# Patient Record
Sex: Male | Born: 1965 | Race: White | Hispanic: No | Smoking: Former smoker
Health system: Southern US, Community
[De-identification: ages and names within clinical notes are randomized; demographics above are authoritative.]

## PROBLEM LIST (undated history)

## (undated) DIAGNOSIS — J939 Pneumothorax, unspecified: Secondary | ICD-10-CM

## (undated) DIAGNOSIS — T7840XA Allergy, unspecified, initial encounter: Secondary | ICD-10-CM

## (undated) HISTORY — DX: Allergy, unspecified, initial encounter: T78.40XA

---

## 2006-02-18 ENCOUNTER — Other Ambulatory Visit: Payer: Self-pay

## 2006-02-19 ENCOUNTER — Inpatient Hospital Stay: Payer: Self-pay

## 2015-04-12 ENCOUNTER — Inpatient Hospital Stay: Payer: Self-pay | Admitting: Certified Registered"

## 2015-04-12 ENCOUNTER — Emergency Department: Payer: Self-pay

## 2015-04-12 ENCOUNTER — Encounter
Admission: EM | Disposition: A | Discharge status: Home / Self Care | Payer: Self-pay | Source: Home / Self Care | Attending: Surgery

## 2015-04-12 ENCOUNTER — Encounter: Payer: Self-pay | Admitting: Emergency Medicine

## 2015-04-12 ENCOUNTER — Inpatient Hospital Stay
Admission: EM | Admit: 2015-04-12 | Discharge: 2015-04-17 | DRG: 339 | Disposition: A | Discharge status: Home / Self Care | Payer: Self-pay | Attending: Surgery | Admitting: Surgery

## 2015-04-12 DIAGNOSIS — K352 Acute appendicitis with generalized peritonitis, without abscess: Secondary | ICD-10-CM

## 2015-04-12 DIAGNOSIS — K353 Acute appendicitis with localized peritonitis: Principal | ICD-10-CM | POA: Diagnosis present

## 2015-04-12 DIAGNOSIS — K35209 Acute appendicitis with generalized peritonitis, without abscess, unspecified as to perforation: Secondary | ICD-10-CM | POA: Insufficient documentation

## 2015-04-12 DIAGNOSIS — R10811 Right upper quadrant abdominal tenderness: Secondary | ICD-10-CM

## 2015-04-12 DIAGNOSIS — K3580 Unspecified acute appendicitis: Secondary | ICD-10-CM | POA: Diagnosis present

## 2015-04-12 DIAGNOSIS — K567 Ileus, unspecified: Secondary | ICD-10-CM | POA: Diagnosis present

## 2015-04-12 DIAGNOSIS — E669 Obesity, unspecified: Secondary | ICD-10-CM | POA: Diagnosis present

## 2015-04-12 DIAGNOSIS — K3533 Acute appendicitis with perforation and localized peritonitis, with abscess: Secondary | ICD-10-CM

## 2015-04-12 DIAGNOSIS — R6883 Chills (without fever): Secondary | ICD-10-CM | POA: Diagnosis present

## 2015-04-12 HISTORY — PX: APPENDECTOMY: SHX54

## 2015-04-12 LAB — LIPASE, BLOOD: LIPASE: 13 U/L — AB (ref 22–51)

## 2015-04-12 LAB — URINALYSIS COMPLETE WITH MICROSCOPIC (ARMC ONLY)
BACTERIA UA: NONE SEEN
BILIRUBIN URINE: NEGATIVE
Glucose, UA: NEGATIVE mg/dL
Hgb urine dipstick: NEGATIVE
Ketones, ur: NEGATIVE mg/dL
Nitrite: NEGATIVE
PROTEIN: 30 mg/dL — AB
SPECIFIC GRAVITY, URINE: 1.021 (ref 1.005–1.030)
SQUAMOUS EPITHELIAL / LPF: NONE SEEN
pH: 6 (ref 5.0–8.0)

## 2015-04-12 LAB — CBC
HCT: 46.8 % (ref 40.0–52.0)
Hemoglobin: 15.8 g/dL (ref 13.0–18.0)
MCH: 31.4 pg (ref 26.0–34.0)
MCHC: 33.7 g/dL (ref 32.0–36.0)
MCV: 93.3 fL (ref 80.0–100.0)
PLATELETS: 276 10*3/uL (ref 150–440)
RBC: 5.01 MIL/uL (ref 4.40–5.90)
RDW: 12.3 % (ref 11.5–14.5)
WBC: 15.4 10*3/uL — AB (ref 3.8–10.6)

## 2015-04-12 LAB — COMPREHENSIVE METABOLIC PANEL
ALK PHOS: 67 U/L (ref 38–126)
ALT: 42 U/L (ref 17–63)
AST: 26 U/L (ref 15–41)
Albumin: 4.3 g/dL (ref 3.5–5.0)
Anion gap: 12 (ref 5–15)
BUN: 12 mg/dL (ref 6–20)
CHLORIDE: 100 mmol/L — AB (ref 101–111)
CO2: 25 mmol/L (ref 22–32)
CREATININE: 1.11 mg/dL (ref 0.61–1.24)
Calcium: 9.3 mg/dL (ref 8.9–10.3)
Glucose, Bld: 131 mg/dL — ABNORMAL HIGH (ref 65–99)
Potassium: 4 mmol/L (ref 3.5–5.1)
Sodium: 137 mmol/L (ref 135–145)
Total Bilirubin: 1.4 mg/dL — ABNORMAL HIGH (ref 0.3–1.2)
Total Protein: 8.1 g/dL (ref 6.5–8.1)

## 2015-04-12 LAB — TROPONIN I

## 2015-04-12 SURGERY — APPENDECTOMY
Anesthesia: General | Wound class: Contaminated

## 2015-04-12 MED ORDER — ACETAMINOPHEN 650 MG RE SUPP
650.0000 mg | Freq: Four times a day (QID) | RECTAL | Status: DC | PRN
Start: 2015-04-12 — End: 2015-04-17

## 2015-04-12 MED ORDER — HYDROMORPHONE HCL 1 MG/ML IJ SOLN
INTRAMUSCULAR | Status: AC
  Filled 2015-04-12: qty 1

## 2015-04-12 MED ORDER — ACETAMINOPHEN 325 MG PO TABS: 650.0000 mg | ORAL_TABLET | Freq: Four times a day (QID) | ORAL | Status: DC | PRN

## 2015-04-12 MED ORDER — DEXTROSE 5 % IV SOLN
1.0000 g | Freq: Once | INTRAVENOUS | Status: AC
  Administered 2015-04-12: 1 g via INTRAVENOUS
  Filled 2015-04-12: qty 1

## 2015-04-12 MED ORDER — DEXTROSE 5 % IV SOLN
1.0000 g | Freq: Two times a day (BID) | INTRAVENOUS | Status: DC
  Filled 2015-04-12 (×2): qty 10

## 2015-04-12 MED ORDER — KETOROLAC TROMETHAMINE 30 MG/ML IJ SOLN
INTRAMUSCULAR | Status: DC | PRN
  Administered 2015-04-12: 30 mg via INTRAVENOUS

## 2015-04-12 MED ORDER — DEXAMETHASONE SODIUM PHOSPHATE 4 MG/ML IJ SOLN
INTRAMUSCULAR | Status: DC | PRN
  Administered 2015-04-12: 10 mg via INTRAVENOUS

## 2015-04-12 MED ORDER — LACTATED RINGERS IV SOLN
INTRAVENOUS | Status: DC
  Administered 2015-04-12 (×2): via INTRAVENOUS

## 2015-04-12 MED ORDER — HYDROMORPHONE HCL 1 MG/ML IJ SOLN
1.0000 mg | INTRAMUSCULAR | Status: DC | PRN
  Administered 2015-04-12 – 2015-04-15 (×10): 1 mg via INTRAVENOUS
  Filled 2015-04-12 (×10): qty 1

## 2015-04-12 MED ORDER — PROPOFOL 10 MG/ML IV BOLUS
INTRAVENOUS | Status: DC | PRN
  Administered 2015-04-12: 180 mg via INTRAVENOUS

## 2015-04-12 MED ORDER — SODIUM CHLORIDE 0.9 % IV BOLUS (SEPSIS)
1000.0000 mL | Freq: Once | INTRAVENOUS | Status: AC
  Administered 2015-04-12: 1000 mL via INTRAVENOUS

## 2015-04-12 MED ORDER — ONDANSETRON HCL 4 MG PO TABS: 4.0000 mg | ORAL_TABLET | Freq: Four times a day (QID) | ORAL | Status: DC | PRN

## 2015-04-12 MED ORDER — LACTATED RINGERS IV SOLN
INTRAVENOUS | Status: DC
  Administered 2015-04-12: 12:00:00 via INTRAVENOUS

## 2015-04-12 MED ORDER — ONDANSETRON HCL 4 MG/2ML IJ SOLN
INTRAMUSCULAR | Status: AC
  Filled 2015-04-12: qty 2

## 2015-04-12 MED ORDER — HYDROCODONE-ACETAMINOPHEN 5-325 MG PO TABS
1.0000 | ORAL_TABLET | ORAL | Status: DC | PRN
  Administered 2015-04-13 – 2015-04-14 (×6): 2 via ORAL
  Administered 2015-04-14 – 2015-04-15 (×2): 1 via ORAL
  Administered 2015-04-16: 2 via ORAL
  Administered 2015-04-16: 1 via ORAL
  Administered 2015-04-16 – 2015-04-17 (×2): 2 via ORAL
  Filled 2015-04-12 (×3): qty 2
  Filled 2015-04-12 (×2): qty 1
  Filled 2015-04-12 (×4): qty 2
  Filled 2015-04-12: qty 1
  Filled 2015-04-12 (×2): qty 2

## 2015-04-12 MED ORDER — DEXTROSE 5 % IV SOLN: 1.0000 g | Freq: Once | INTRAVENOUS | Status: DC

## 2015-04-12 MED ORDER — FENTANYL CITRATE (PF) 100 MCG/2ML IJ SOLN
25.0000 ug | INTRAMUSCULAR | Status: DC | PRN
  Administered 2015-04-12 (×6): 25 ug via INTRAVENOUS

## 2015-04-12 MED ORDER — HYDROMORPHONE HCL 1 MG/ML IJ SOLN
1.0000 mg | Freq: Once | INTRAMUSCULAR | Status: AC
  Administered 2015-04-12: 1 mg via INTRAVENOUS

## 2015-04-12 MED ORDER — NEOSTIGMINE METHYLSULFATE 10 MG/10ML IV SOLN
INTRAVENOUS | Status: DC | PRN
  Administered 2015-04-12: 3 mg via INTRAVENOUS

## 2015-04-12 MED ORDER — METRONIDAZOLE IN NACL 5-0.79 MG/ML-% IV SOLN
500.0000 mg | Freq: Three times a day (TID) | INTRAVENOUS | Status: DC
  Administered 2015-04-12: 500 mg via INTRAVENOUS
  Filled 2015-04-12 (×3): qty 100

## 2015-04-12 MED ORDER — ONDANSETRON HCL 4 MG/2ML IJ SOLN: 4.0000 mg | Freq: Once | INTRAMUSCULAR | Status: DC | PRN

## 2015-04-12 MED ORDER — ROCURONIUM BROMIDE 100 MG/10ML IV SOLN
INTRAVENOUS | Status: DC | PRN
  Administered 2015-04-12: 20 mg via INTRAVENOUS
  Administered 2015-04-12 (×3): 10 mg via INTRAVENOUS
  Administered 2015-04-12: 30 mg via INTRAVENOUS

## 2015-04-12 MED ORDER — FENTANYL CITRATE (PF) 100 MCG/2ML IJ SOLN
INTRAMUSCULAR | Status: AC
  Administered 2015-04-12: 25 ug via INTRAVENOUS
  Filled 2015-04-12: qty 2

## 2015-04-12 MED ORDER — ACETAMINOPHEN 650 MG RE SUPP: 650.0000 mg | Freq: Four times a day (QID) | RECTAL | Status: DC | PRN

## 2015-04-12 MED ORDER — ONDANSETRON HCL 4 MG/2ML IJ SOLN
4.0000 mg | Freq: Once | INTRAMUSCULAR | Status: AC
  Administered 2015-04-12: 4 mg via INTRAVENOUS
  Filled 2015-04-12: qty 2

## 2015-04-12 MED ORDER — PIPERACILLIN-TAZOBACTAM 4.5 G IVPB
4.5000 g | Freq: Three times a day (TID) | INTRAVENOUS | Status: DC
  Administered 2015-04-12 – 2015-04-16 (×11): 4.5 g via INTRAVENOUS
  Filled 2015-04-12 (×15): qty 100

## 2015-04-12 MED ORDER — MIDAZOLAM HCL 2 MG/2ML IJ SOLN
INTRAMUSCULAR | Status: DC | PRN
  Administered 2015-04-12: 2 mg via INTRAVENOUS

## 2015-04-12 MED ORDER — ONDANSETRON HCL 4 MG/2ML IJ SOLN
4.0000 mg | Freq: Four times a day (QID) | INTRAMUSCULAR | Status: DC | PRN
  Administered 2015-04-13 – 2015-04-15 (×3): 4 mg via INTRAVENOUS
  Filled 2015-04-12 (×3): qty 2

## 2015-04-12 MED ORDER — SUCCINYLCHOLINE CHLORIDE 20 MG/ML IJ SOLN
INTRAMUSCULAR | Status: DC | PRN
  Administered 2015-04-12: 100 mg via INTRAVENOUS

## 2015-04-12 MED ORDER — IOHEXOL 240 MG/ML SOLN
25.0000 mL | Freq: Once | INTRAMUSCULAR | Status: AC | PRN
  Administered 2015-04-12: 25 mL via ORAL

## 2015-04-12 MED ORDER — ONDANSETRON HCL 4 MG/2ML IJ SOLN
INTRAMUSCULAR | Status: DC | PRN
  Administered 2015-04-12: 4 mg via INTRAVENOUS

## 2015-04-12 MED ORDER — ENOXAPARIN SODIUM 40 MG/0.4ML ~~LOC~~ SOLN
40.0000 mg | SUBCUTANEOUS | Status: DC
  Administered 2015-04-12 – 2015-04-16 (×5): 40 mg via SUBCUTANEOUS
  Filled 2015-04-12 (×5): qty 0.4

## 2015-04-12 MED ORDER — MORPHINE SULFATE 2 MG/ML IJ SOLN
2.0000 mg | INTRAMUSCULAR | Status: DC | PRN
  Administered 2015-04-12: 2 mg via INTRAVENOUS
  Filled 2015-04-12: qty 1

## 2015-04-12 MED ORDER — ONDANSETRON HCL 4 MG/2ML IJ SOLN
4.0000 mg | Freq: Once | INTRAMUSCULAR | Status: AC
  Administered 2015-04-12: 4 mg via INTRAVENOUS

## 2015-04-12 MED ORDER — FENTANYL CITRATE (PF) 100 MCG/2ML IJ SOLN
INTRAMUSCULAR | Status: DC | PRN
  Administered 2015-04-12 (×4): 50 ug via INTRAVENOUS

## 2015-04-12 MED ORDER — PIPERACILLIN-TAZOBACTAM 3.375 G IVPB 30 MIN: 3.3750 g | Freq: Four times a day (QID) | INTRAVENOUS | Status: DC

## 2015-04-12 MED ORDER — GLYCOPYRROLATE 0.2 MG/ML IJ SOLN
INTRAMUSCULAR | Status: DC | PRN
  Administered 2015-04-12: 0.6 mg via INTRAVENOUS
  Administered 2015-04-12: 0.2 mg via INTRAVENOUS

## 2015-04-12 MED ORDER — PIPERACILLIN-TAZOBACTAM 3.375 G IVPB
3.3750 g | Freq: Once | INTRAVENOUS | Status: AC
  Administered 2015-04-12: 3.375 g via INTRAVENOUS
  Filled 2015-04-12: qty 50

## 2015-04-12 MED ORDER — HYDROMORPHONE HCL 1 MG/ML IJ SOLN
1.0000 mg | Freq: Once | INTRAMUSCULAR | Status: AC
  Administered 2015-04-12: 1 mg via INTRAVENOUS
  Filled 2015-04-12: qty 1

## 2015-04-12 MED ORDER — IOHEXOL 300 MG/ML  SOLN
125.0000 mL | Freq: Once | INTRAMUSCULAR | Status: AC | PRN
  Administered 2015-04-12: 125 mL via INTRAVENOUS

## 2015-04-12 MED ORDER — LIDOCAINE HCL (CARDIAC) 20 MG/ML IV SOLN
INTRAVENOUS | Status: DC | PRN
  Administered 2015-04-12: 100 mg via INTRAVENOUS

## 2015-04-12 SURGICAL SUPPLY — 55 items
BULB RESERV EVAC DRAIN JP 100C (MISCELLANEOUS) ×4 IMPLANT
CANISTER SUCT 1200ML W/VALVE (MISCELLANEOUS) ×4 IMPLANT
CHLORAPREP W/TINT 26ML (MISCELLANEOUS) ×4 IMPLANT
CLEANER CAUTERY TIP 5X5 PAD (MISCELLANEOUS) ×2 IMPLANT
CLOSURE WOUND 1/2 X4 (GAUZE/BANDAGES/DRESSINGS) ×1
CUTTER LINEAR ENDO 35 ART FLEX (STAPLE) ×4 IMPLANT
CUTTER LINEAR ENDO 35 ETS (STAPLE) ×4 IMPLANT
DEFOGGER SCOPE WARMER CLEARIFY (MISCELLANEOUS) ×4 IMPLANT
DRAIN CHANNEL JP 19F (MISCELLANEOUS) ×4 IMPLANT
DRAPE SHEET LG 3/4 BI-LAMINATE (DRAPES) ×4 IMPLANT
DRAPE UTILITY 15X26 TOWEL STRL (DRAPES) ×8 IMPLANT
DRESSING TELFA 4X3 1S ST N-ADH (GAUZE/BANDAGES/DRESSINGS) ×4 IMPLANT
DRSG OPSITE POSTOP 3X4 (GAUZE/BANDAGES/DRESSINGS) ×8 IMPLANT
DRSG OPSITE POSTOP 4X8 (GAUZE/BANDAGES/DRESSINGS) ×4 IMPLANT
DRSG TEGADERM 2-3/8X2-3/4 SM (GAUZE/BANDAGES/DRESSINGS) ×12 IMPLANT
ENDOPOUCH RETRIEVER 10 (MISCELLANEOUS) ×4 IMPLANT
GAUZE SPONGE 4X4 12PLY STRL (GAUZE/BANDAGES/DRESSINGS) ×4 IMPLANT
GLOVE BIO SURGEON STRL SZ7.5 (GLOVE) ×4 IMPLANT
GOWN STRL REUS W/ TWL LRG LVL3 (GOWN DISPOSABLE) ×2 IMPLANT
GOWN STRL REUS W/ TWL XL LVL3 (GOWN DISPOSABLE) ×2 IMPLANT
GOWN STRL REUS W/TWL LRG LVL3 (GOWN DISPOSABLE) ×2
GOWN STRL REUS W/TWL XL LVL3 (GOWN DISPOSABLE) ×2
HANDLE YANKAUER SUCT BULB TIP (MISCELLANEOUS) ×4 IMPLANT
IRRIGATION STRYKERFLOW (MISCELLANEOUS) ×2 IMPLANT
IRRIGATOR STRYKERFLOW (MISCELLANEOUS) ×4
IV NS 1000ML (IV SOLUTION) ×2
IV NS 1000ML BAXH (IV SOLUTION) ×2 IMPLANT
LABEL OR SOLS (LABEL) ×4 IMPLANT
LAPSAC SURG PACK 8X10 (MISCELLANEOUS) ×8
NDL SAFETY 25GX1.5 (NEEDLE) ×4 IMPLANT
NS IRRIG 500ML POUR BTL (IV SOLUTION) ×4 IMPLANT
PACK LAP CHOLECYSTECTOMY (MISCELLANEOUS) ×4 IMPLANT
PACK SURG LAPSAC 8X10 (MISCELLANEOUS) ×4 IMPLANT
PAD CLEANER CAUTERY TIP 5X5 (MISCELLANEOUS) ×2
PAD GROUND ADULT SPLIT (MISCELLANEOUS) ×4 IMPLANT
PENCIL ELECTRO HAND CTR (MISCELLANEOUS) ×4 IMPLANT
RELOAD CUTTER ETS 35MM STAND (ENDOMECHANICALS) ×4 IMPLANT
SCISSORS METZENBAUM CVD 33 (INSTRUMENTS) ×4 IMPLANT
SHEARS HARMONIC ACE PLUS 36CM (ENDOMECHANICALS) ×4 IMPLANT
SLEEVE ENDOPATH XCEL 5M (ENDOMECHANICALS) ×4 IMPLANT
SPONGE DRAIN TRACH 4X4 STRL 2S (GAUZE/BANDAGES/DRESSINGS) ×4 IMPLANT
STAPLER SKIN PROX 35W (STAPLE) ×4 IMPLANT
STRAP SAFETY BODY (MISCELLANEOUS) ×4 IMPLANT
STRIP CLOSURE SKIN 1/2X4 (GAUZE/BANDAGES/DRESSINGS) ×3 IMPLANT
SUT ETHILON 3-0 KS 30 BLK (SUTURE) ×4 IMPLANT
SUT VIC AB 0 CT1 36 (SUTURE) ×12 IMPLANT
SUT VIC AB 0 UR5 27 (SUTURE) ×8 IMPLANT
SUT VIC AB 1 CT1 36 (SUTURE) ×12 IMPLANT
SUT VIC AB 4-0 RB1 27 (SUTURE) ×2
SUT VIC AB 4-0 RB1 27X BRD (SUTURE) ×2 IMPLANT
SWABSTK COMLB BENZOIN TINCTURE (MISCELLANEOUS) ×4 IMPLANT
TROCAR XCEL 12X100 BLDLESS (ENDOMECHANICALS) ×4 IMPLANT
TROCAR XCEL BLUNT TIP 100MML (ENDOMECHANICALS) ×4 IMPLANT
TROCAR XCEL NON-BLD 5MMX100MML (ENDOMECHANICALS) ×4 IMPLANT
TUBING INSUFFLATOR HI FLOW (MISCELLANEOUS) ×4 IMPLANT

## 2015-04-12 NOTE — ED Provider Notes (Addendum)
[facility] Emergency Department Provider Note  ____________________________________________  Time seen: 7:40 AM  I have reviewed the triage vital signs and the nursing notes.   HISTORY  Chief Complaint Abdominal Pain    HPI Ian Pennington is a 49 y.o. male who complains of right upper quadrant abdominal pain and further right-sided abdominal pain for the last 3-4 days. The pain was gradual in onset and is intermittent and colicky and worse after eating. Over the last 24 hours it is become much worse. Denies fever or chills, but has had one to 2 episodes of vomiting and has persistent nausea. No change in bowel movements, no diarrhea. No chest pain or shortness of breath.  Pain is sharp severe nonradiating no aggravating or alleviating factors.  No prior surgeries. Last oral intake was yesterday except for a small amount of water this morning which she vomited.   History reviewed. No pertinent past medical history.  There are no active problems to display for this patient.   History reviewed. No pertinent past surgical history.  Current Outpatient Rx  Name  Route  Sig  Dispense  Refill  . Naproxen Sodium (ALEVE PO)   Oral   Take 2 tablets by mouth once.           Allergies Review of patient's allergies indicates no known allergies.  No family history on file.  Social History History  Substance Use Topics  . Smoking status: Never Smoker   . Smokeless tobacco: Not on file  . Alcohol Use: Yes     Comment: occasionally    Review of Systems  Constitutional: No fever or chills. No weight changes Eyes:No blurry vision or double vision.  ENT: No sore throat. Cardiovascular: No chest pain. Respiratory: No dyspnea or cough. Gastrointestinal: abdominal pain with vomiting as above.  No BRBPR or melena. Genitourinary: Negative for dysuria, urinary retention, bloody urine, or difficulty urinating. Musculoskeletal: Negative for back pain. No  joint swelling or pain. Skin: Negative for rash. Neurological: Negative for headaches, focal weakness or numbness. Psychiatric:No anxiety or depression.   Endocrine:No hot/cold intolerance, changes in energy, or sleep difficulty.  10-point ROS otherwise negative.  ____________________________________________   PHYSICAL EXAM:  VITAL SIGNS: ED Triage Vitals  Enc Vitals Group     BP 04/12/15 0719 135/80 mmHg     Pulse Rate 04/12/15 0719 88     Resp 04/12/15 0719 16     Temp 04/12/15 0719 98.3 F (36.8 C)     Temp Source 04/12/15 0719 Oral     SpO2 04/12/15 0719 98 %     Weight 04/12/15 0711 300 lb (136.079 kg)     Height 04/12/15 0711 6\' 4"  (1.93 m)     Head Cir --      Peak Flow --      Pain Score 04/12/15 0711 8     Pain Loc --      Pain Edu? --      Excl. in GC? --      Constitutional: Alert and oriented. Moderate distress due to pain. Eyes: No scleral icterus. No conjunctival pallor. PERRL. EOMI ENT   Head: Normocephalic and atraumatic.   Nose: No congestion/rhinnorhea. No septal hematoma   Mouth/Throat: MMM, no pharyngeal erythema. No peritonsillar mass. No uvula shift.   Neck: No stridor. No SubQ emphysema. No meningismus. Hematological/Lymphatic/Immunilogical: No cervical lymphadenopathy. Cardiovascular: RRR. Normal and symmetric distal pulses are present in all extremities. No murmurs, rubs, or gallops. Respiratory: Normal respiratory effort  without tachypnea nor retractions. Breath sounds are clear and equal bilaterally. No wheezes/rales/rhonchi. Gastrointestinal:guarding with palpation, tender in the right upper quadrant, right mid abdomen, and right lower quadrant. Worst in the mid abdomen. There is also discomfort with bringing the right knee up toward the chest.. No distention. There is no CVA tenderness.  Abdomen is not rigid. Genitourinary: deferred Musculoskeletal: Nontender with normal range of motion in all extremities. No joint effusions.   No lower extremity tenderness.  No edema. Neurologic:   Normal speech and language.  CN 2-10 normal. Motor grossly intact. No pronator drift.  Normal gait. No gross focal neurologic deficits are appreciated.  Skin:  Skin is warm, dry and intact. No rash noted.  No petechiae, purpura, or bullae. Psychiatric: Mood and affect are normal. Speech and behavior are normal. Patient exhibits appropriate insight and judgment.  ____________________________________________    LABS (pertinent positives/negatives) (all labs ordered are listed, but only abnormal results are displayed) Labs Reviewed  LIPASE, BLOOD - Abnormal; Notable for the following:    Lipase 13 (*)    All other components within normal limits  COMPREHENSIVE METABOLIC PANEL - Abnormal; Notable for the following:    Chloride 100 (*)    Glucose, Bld 131 (*)    Total Bilirubin 1.4 (*)    All other components within normal limits  CBC - Abnormal; Notable for the following:    WBC 15.4 (*)    All other components within normal limits  URINALYSIS COMPLETEWITH MICROSCOPIC ([facility] ONLY) - Abnormal; Notable for the following:    Color, Urine AMBER (*)    APPearance CLEAR (*)    Protein, ur 30 (*)    Leukocytes, UA TRACE (*)    All other components within normal limits   ____________________________________________   EKG  Interpreted by me Normal sinus rhythm rate of 73, normal axis and intervals, normal QRS and ST segments with mildly downsloping and inverted T waves in V4 through V6  ____________________________________________    RADIOLOGY  Discussed with radiology, reviewed by me, report reviewed by me. Acute appendicitis with evidence of possible rupture an early phlegmon formation  ____________________________________________   PROCEDURES  ____________________________________________   INITIAL IMPRESSION / ASSESSMENT AND PLAN / ED COURSE  Pertinent labs & imaging results that were available during my care of  the patient were reviewed by me and considered in my medical decision making (see chart for details).  Patient presents with right-sided abdominal pain. Due to the colicky nature that is worsened by eating, this seems more consistent with biliary colic or cholecystitis. We'll start with an ultrasound of the right upper quadrant. However if this is unremarkable, we will have to proceed with a CT scan of the abdomen due to also potential possibility of appendicitis. IV fluids, IV Dilaudid and Zofran for symptom control.  ----------------------------------------- 11:34 AM on 04/12/2015 -----------------------------------------  Ultrasound result was obtained at 9:40 AM which was unremarkable, and reevaluation revealed that the patient was still having significant pain and tenderness in the right side of the abdomen. We proceeded with CT scan at that time. I just recently received a call from radiology that the patient has significant appendicitis. He still complains of severe pain, so we will give him more Zofran and Dilaudid and start him on maintenance IV fluids. Surgery will be consult for further management. ----------------------------------------- 11:41 AM on 04/12/2015 -----------------------------------------  Surgery unavailable due to being in the OR just starting a case. I discussed the case with Dr. Phineas Real OR nurse who  will pass along the consult information. Due to delay in surgery availability and possible rupture with evidence of peritonitis, we will go ahead and start the patient on IV Zosyn.  ----------------------------------------- 12:07 PM on 04/12/2015 -----------------------------------------  Screening EKG suggestive of possible lateral ischemia although symptoms are not consistent with ACS. We'll check a troponin for further screening. ____________________________________________   FINAL CLINICAL IMPRESSION(S) / ED DIAGNOSES  Final diagnoses:  RUQ abdominal tenderness   Acute appendicitis with generalized peritonitis      Sharman Cheek, MD 04/12/15 1142  Sharman Cheek, MD 04/12/15 1207

## 2015-04-12 NOTE — ED Notes (Signed)
Patient presents to the ED with right upper quadrant and central abdominal pain with nausea and some diarrhea since Friday.  Patient states pain is much worse after he eats.  Patient is gaurding abdomen in triage.  Patient reports similar episode in the beginning of July but states pain improved away after a few days.

## 2015-04-12 NOTE — Anesthesia Preprocedure Evaluation (Addendum)
Anesthesia Evaluation  Patient identified by MRN, date of birth, ID band Patient awake    Reviewed: Allergy & Precautions, NPO status , Patient's Chart, lab work & pertinent test results, reviewed documented beta blocker date and time   Airway Mallampati: III  TM Distance: >3 FB     Dental  (+) Chipped, Poor Dentition   Pulmonary          Cardiovascular     Neuro/Psych    GI/Hepatic   Endo/Other  Morbid obesity  Renal/GU      Musculoskeletal   Abdominal   Peds  Hematology   Anesthesia Other Findings Obesity. May be a difficult intubation.  Reproductive/Obstetrics                            Anesthesia Physical Anesthesia Plan  ASA: III  Anesthesia Plan: General   Post-op Pain Management:    Induction: Intravenous  Airway Management Planned: Oral ETT  Additional Equipment:   Intra-op Plan:   Post-operative Plan:   Informed Consent: I have reviewed the patients History and Physical, chart, labs and discussed the procedure including the risks, benefits and alternatives for the proposed anesthesia with the patient or authorized representative who has indicated his/her understanding and acceptance.     Plan Discussed with: CRNA  Anesthesia Plan Comments:         Anesthesia Quick Evaluation

## 2015-04-12 NOTE — H&P (Signed)
Ian Pennington is an 49 y.o. male.     Chief Complaint: Right lower quadrant abdominal pain and nausea  HPI:   49 year old otherwise healthy obese male presents to the emergency room with three-day history of right lower quadrant abdominal pain associated with nausea and some mild anorexia and no fever no sick contacts pain has gotten worse over the last several days. Of note the patient had a similar episode which resolved on its own approximate month ago when another episode 2 weeks ago. He did not seek medical attention at that time.  He denies dysuria. No jaundice no fever.  History reviewed. No pertinent past medical history.  History reviewed. No pertinent past surgical history.  Family history significant for obesity  Social History:  reports that he has never smoked. He does not have any smokeless tobacco history on file. He reports that he drinks alcohol. His drug history is not on file.  Allergies: No Known Allergies   Review of Systems  Constitutional: Positive for malaise/fatigue. Negative for fever, chills and weight loss.  HENT: Negative.   Gastrointestinal: Positive for heartburn and abdominal pain. Negative for constipation.  Skin: Negative.   Neurological: Positive for weakness.  All other systems reviewed and are negative.   Physical Exam:  Physical Exam  Constitutional: He is oriented to person, place, and time and well-developed, well-nourished, and in no distress.  HENT:  Head: Normocephalic and atraumatic.  Eyes: Conjunctivae are normal. Pupils are equal, round, and reactive to light.  Neck: Normal range of motion.  Cardiovascular: Normal rate and regular rhythm.   Pulmonary/Chest: Breath sounds normal. No respiratory distress.  Abdominal: Soft. There is tenderness. There is rebound.    Neurological: He is oriented to person, place, and time.  Skin: Skin is warm and dry.  Psychiatric: Mood, memory, affect and judgment normal.    Blood pressure  137/64, pulse 86, temperature 98.2 F (36.8 C), temperature source Oral, resp. rate 17, height 6' 4"  (1.93 m), weight 300 lb (136.079 kg), SpO2 99 %.  Results for orders placed or performed during the hospital encounter of 04/12/15 (from the past 48 hour(s))  Lipase, blood     Status: Abnormal   Collection Time: 04/12/15  7:18 AM  Result Value Ref Range   Lipase 13 (L) 22 - 51 U/L  Comprehensive metabolic panel     Status: Abnormal   Collection Time: 04/12/15  7:18 AM  Result Value Ref Range   Sodium 137 135 - 145 mmol/L   Potassium 4.0 3.5 - 5.1 mmol/L   Chloride 100 (L) 101 - 111 mmol/L   CO2 25 22 - 32 mmol/L   Glucose, Bld 131 (H) 65 - 99 mg/dL   BUN 12 6 - 20 mg/dL   Creatinine, Ser 1.11 0.61 - 1.24 mg/dL   Calcium 9.3 8.9 - 10.3 mg/dL   Total Protein 8.1 6.5 - 8.1 g/dL   Albumin 4.3 3.5 - 5.0 g/dL   AST 26 15 - 41 U/L   ALT 42 17 - 63 U/L   Alkaline Phosphatase 67 38 - 126 U/L   Total Bilirubin 1.4 (H) 0.3 - 1.2 mg/dL   GFR calc non Af Amer >60 >60 mL/min   GFR calc Af Amer >60 >60 mL/min    Comment: (NOTE) The eGFR has been calculated using the CKD EPI equation. This calculation has not been validated in all clinical situations. eGFR's persistently <60 mL/min signify possible Chronic Kidney Disease.    Anion gap 12  5 - 15  CBC     Status: Abnormal   Collection Time: 04/12/15  7:18 AM  Result Value Ref Range   WBC 15.4 (H) 3.8 - 10.6 K/uL   RBC 5.01 4.40 - 5.90 MIL/uL   Hemoglobin 15.8 13.0 - 18.0 g/dL   HCT 46.8 40.0 - 52.0 %   MCV 93.3 80.0 - 100.0 fL   MCH 31.4 26.0 - 34.0 pg   MCHC 33.7 32.0 - 36.0 g/dL   RDW 12.3 11.5 - 14.5 %   Platelets 276 150 - 440 K/uL  Urinalysis complete, with microscopic ([facility] only)     Status: Abnormal   Collection Time: 04/12/15  7:18 AM  Result Value Ref Range   Color, Urine AMBER (A) YELLOW   APPearance CLEAR (A) CLEAR   Glucose, UA NEGATIVE NEGATIVE mg/dL   Bilirubin Urine NEGATIVE NEGATIVE   Ketones, ur NEGATIVE NEGATIVE  mg/dL   Specific Gravity, Urine 1.021 1.005 - 1.030   Hgb urine dipstick NEGATIVE NEGATIVE   pH 6.0 5.0 - 8.0   Protein, ur 30 (A) NEGATIVE mg/dL   Nitrite NEGATIVE NEGATIVE   Leukocytes, UA TRACE (A) NEGATIVE   RBC / HPF 0-5 0 - 5 RBC/hpf   WBC, UA 0-5 0 - 5 WBC/hpf   Bacteria, UA NONE SEEN NONE SEEN   Squamous Epithelial / LPF NONE SEEN NONE SEEN   Mucous PRESENT   Troponin I     Status: None   Collection Time: 04/12/15  7:18 AM  Result Value Ref Range   Troponin I <0.03 <0.031 ng/mL    Comment:        NO INDICATION OF MYOCARDIAL INJURY.    Ct Abdomen Pelvis W Contrast  04/12/2015   CLINICAL DATA:  49 year old male with right lower quadrant pain, fever and nausea with vomiting. Initial encounter.  EXAM: CT ABDOMEN AND PELVIS WITH CONTRAST  TECHNIQUE: Multidetector CT imaging of the abdomen and pelvis was performed using the standard protocol following bolus administration of intravenous contrast.  CONTRAST:  173m OMNIPAQUE IOHEXOL 300 MG/ML  SOLN  COMPARISON:  04/12/2015 ultrasound. Report of CT abdomen pelvis 02/19/2006.  FINDINGS: Diffuse enlargement and inflammation of retrocecal appendix with adjacent inflammation of surrounding fat planes consistent with appendicitis which may have ruptured. No free intraperitoneal air.  Fatty infiltration of the liver. Slightly lobulated contour without other findings of cirrhosis. No calcified gallstones.  No splenic, pancreatic, renal or right adrenal lesion. Left adrenal 2.7 x 1.9 x 1.8 cm low-density structure possibly adenoma but not able to be confirmed as such on the present exam. This can be evaluated with CT of the abdomen without contrast after appendicitis has been treated.  Lung bases clear. Heart size within normal limits. Small calcifications adjacent to the right coronary artery.  No abdominal aortic aneurysm. Minimal calcification left iliac artery.  Scattered colonic diverticula.  Noncontrast filled views of the urinary bladder  unremarkable. Prostate does not appear enlarged.  Portacaval lymph nodes slightly prominent size measuring up to 1.3 cm short axis dimension. Top-normal size peripancreatic lymph nodes. Etiology/significance indeterminate.  Mild degenerative changes L3-4 and L4-5.  IMPRESSION: Diffuse enlargement and inflammation of retrocecal appendix with adjacent inflammation of surrounding fat planes consistent with appendicitis which may have ruptured.  Fatty infiltration of the liver. Slightly lobulated contour without other findings of cirrhosis.  Left adrenal 2.7 x 1.9 x 1.8 cm low-density structure possibly an adenoma but not able to be confirmed as such on the present exam. This can  be evaluated with CT of the abdomen without contrast after appendicitis has been treated.  Portacaval lymph node is slightly prominent in size measuring up to 1.3 cm short axis dimension. Top-normal size peripancreatic lymph nodes. Etiology/significance indeterminate.  These results were called by telephone at the time of interpretation on 04/12/2015 at 11:21 am to Dr. Carrie Mew , who verbally acknowledged these results.   Electronically Signed   By: Genia Del M.D.   On: 04/12/2015 11:34   US Abdomen Limited Ruq  04/12/2015   CLINICAL DATA:  Right upper quadrant pain  EXAM: US ABDOMEN LIMITED - RIGHT UPPER QUADRANT  COMPARISON:  CT scan 02/19/2006  FINDINGS: Gallbladder:  No gallstones or wall thickening visualized. No sonographic Murphy sign noted.  Common bile duct:  Diameter: 2.2 mm in diameter within normal limits.  Liver:  No focal lesion identified. Within normal limits in parenchymal echogenicity.  IMPRESSION: Normal right upper quadrant ultrasound.   Electronically Signed   By: Lahoma Crocker M.D.   On: 04/12/2015 08:32     Assessment/Plan 49 year old male with likely perforated retrocecal appendicitis of several days duration leukocytosis and a small left adrenal nodule which will require outpatient follow-up.  Discussed  with him and his mother who was present laparoscopic appendectomy the possibility of needing an open operation. All the questions were answered. Informed consent was obtained. Hortencia Conradi, MD, FACS

## 2015-04-12 NOTE — Anesthesia Postprocedure Evaluation (Signed)
  Anesthesia Post-op Note  Patient: Ian Pennington  Procedure(s) Performed: Procedure(s) with comments: APPENDECTOMY - laparoscopic appendectomy converted to open appendectomy  Anesthesia type:General  Patient location: PACU  Post pain: Pain level controlled  Post assessment: Post-op Vital signs reviewed, Patient's Cardiovascular Status Stable, Respiratory Function Stable, Patent Airway and No signs of Nausea or vomiting  Post vital signs: Reviewed and stable  Last Vitals:  Filed Vitals:   04/12/15 2049  BP: 118/66  Pulse: 84  Temp: 36.8 C  Resp: 17    Level of consciousness: awake, alert  and patient cooperative  Complications: No apparent anesthesia complications

## 2015-04-12 NOTE — Anesthesia Procedure Notes (Signed)
Procedure Name: Intubation Date/Time: 04/12/2015 4:06 PM Performed by: Michaele Offer Pre-anesthesia Checklist: Patient identified, Emergency Drugs available, Suction available, Patient being monitored and Timeout performed Patient Re-evaluated:Patient Re-evaluated prior to inductionOxygen Delivery Method: Circle system utilized Preoxygenation: Pre-oxygenation with 100% oxygen Intubation Type: IV induction and Cricoid Pressure applied Ventilation: Two handed mask ventilation required and Oral airway inserted - appropriate to patient size Laryngoscope Size: McGraph and 4 Grade View: Grade II Tube type: Oral Tube size: 7.5 mm Number of attempts: 1 Airway Equipment and Method: Rigid stylet Placement Confirmation: ETT inserted through vocal cords under direct vision,  positive ETCO2 and breath sounds checked- equal and bilateral Secured at: 23 cm Tube secured with: Tape Dental Injury: Teeth and Oropharynx as per pre-operative assessment

## 2015-04-12 NOTE — Transfer of Care (Signed)
Immediate Anesthesia Transfer of Care Note  Patient: Ian Pennington  Procedure(s) Performed: Procedure(s) with comments: APPENDECTOMY - laparoscopic appendectomy converted to open appendectomy  Patient Location: PACU  Anesthesia Type:General  Level of Consciousness: awake, alert  and oriented  Airway & Oxygen Therapy: Patient Spontanous Breathing and Patient connected to face mask oxygen  Post-op Assessment: Report given to RN and Post -op Vital signs reviewed and stable  Post vital signs: Reviewed and stable  Last Vitals:  Filed Vitals:   04/12/15 1803  BP: 149/70  Pulse: 93  Temp: 38.1 C  Resp: 30    Complications: No apparent anesthesia complications

## 2015-04-12 NOTE — Op Note (Signed)
04/12/2015  6:02 PM  PATIENT:  Thayer Dallas  49 y.o. male  PRE-OPERATIVE DIAGNOSIS:  appendicitis   POST-OPERATIVE DIAGNOSIS:  appendicitis   PROCEDURE:  Procedure(s) with comments: APPENDECTOMY - laparoscopic appendectomy converted to open appendectomy  SURGEON:  Surgeon(s) and Role:    * Natale Lay, MD - Primary  ASSISTANTS: Melbourne Abts, MD  ANESTHESIA:general  SPECIMEN:none  EBL: minimal  Findings: Peri-appendiceal abscess. I could not clearly identify the appendix. The area of the appendiceal stump appeared to have granulation tissue around it and connected to the abscess cavity.  Description of procedure:    With informed consent in supine position general endotracheal anesthesia was induced. The patient's abdomen was clipped of hair sterilely prepped and draped with chlor prep solution and a time out was observed.    A 12 mm blunt Hassan trocar was placed through a transversely oriented skin incision with stay sutures being passed through the fascia.  Pneumoperitoneum was established. 5 mm trocar was placed in the epigastric region and a 5 mm trocar was placed in the suprapubic midline. There was no obvious pus within the abdomen or pelvis. There was a mild ileus present.   The cecum was identified along with the terminal ileum. The cecum was rotated medially and noted to be involved in a dense inflammatory reaction. No clear tissue planes were identified. In the rolling over process a large amount of pus was aspirated which appeared to be coming from the retrocecal area. After several minutes of dissection laparoscopically it became obvious that an open technique would be warranted. As such a right lower quadrant skin incision was fashioned. The anterior fascia being divided an oblique orientation. The muscle was split. The posterior fascia was entered. Small bowel was packed out of the way with laparotomy tapes. Lateral to the cecum was an inflammatory process. Dissection of  which demonstrated the above-noted findings.   I asked Dr Evette Cristal for intraoperative consultation. He agreed with the findings that the appendix had likely been digested in the abscess process. A 19 mm Blake drain was directed into the space exiting the lower most trocar site. Drain exit site was secured with a 3-0 nylon. With lap and needle count correct 2 the abdomen was irrigated in the right lower quadrant and aspirated dry. The infraumbilical fascial defect was reapproximated with an additional figure-of-eight #0 Vicryl suture in vertical orientation.    The posterior fascia was reapproximated with a running locking #1 Vicryl from the extremes of the wound. The muscle layer was reapproximated with several interrupted figure-of-eight #0 Vicryls sutures. The anterior fascia was reapproximated in running fashion from the extremes of the wound with #1 Vicryls. Subcutaneous tissues were then irrigated. Skin edges were reapproximated utilizing a skin stapler. Sterile dressing was applied. The patient was subsequently extubated and taken to the recovery room in stable and satisfactory condition by anesthesia.

## 2015-04-12 NOTE — ED Notes (Signed)
Pt taken to OR.

## 2015-04-13 LAB — CBC
HCT: 42.8 % (ref 40.0–52.0)
HEMOGLOBIN: 14.7 g/dL (ref 13.0–18.0)
MCH: 32.1 pg (ref 26.0–34.0)
MCHC: 34.4 g/dL (ref 32.0–36.0)
MCV: 93.4 fL (ref 80.0–100.0)
Platelets: 232 10*3/uL (ref 150–440)
RBC: 4.58 MIL/uL (ref 4.40–5.90)
RDW: 12.4 % (ref 11.5–14.5)
WBC: 14.2 10*3/uL — ABNORMAL HIGH (ref 3.8–10.6)

## 2015-04-13 MED ORDER — KCL IN DEXTROSE-NACL 20-5-0.45 MEQ/L-%-% IV SOLN
INTRAVENOUS | Status: DC
  Administered 2015-04-13 – 2015-04-16 (×7): via INTRAVENOUS
  Filled 2015-04-13 (×12): qty 1000

## 2015-04-13 NOTE — Progress Notes (Signed)
Patient ID: Ian Pennington, male   DOB: June 13, 1966, 49 y.o.   MRN: [id number]   Post- operative day #1 status post drainage of an periappendiceal abscess.  Patient is feeling much better. Pain seems to be well-controlled. He is passing flatus.  Filed Vitals:   04/13/15 0021 04/13/15 0500 04/13/15 0738 04/13/15 1544  BP: 116/59  107/61 116/69  Pulse: 63  64 67  Temp: 97.4 F (36.3 C)  97.7 F (36.5 C) 97.3 F (36.3 C)  TempSrc: Oral  Oral Oral  Resp: Height:      Weight:  304 lb 4.8 oz (138.03 kg)    SpO2: 95%  94% 95%    CBC Latest Ref Rng 04/13/2015 04/12/2015  WBC 3.8 - 10.6 K/uL 14.2(H) 15.4(H)  Hemoglobin 13.0 - 18.0 g/dL 19.1 47.8  Hematocrit 29.5 - 52.0 % 42.8 46.8  Platelets 150 - 440 K/uL 232 276   Physical examination dressings are dry. Jackson-Pratt demonstrates serosanguineous fluid.  Impression status post incision and drainage of periappendiceal abscess via laparotomy.  Plan:  continue drain antibiotics Full liquid diet.  I discussed with him the operative findings rationale for further therapy he understands and wishes to proceed with this course of action.

## 2015-04-14 ENCOUNTER — Encounter: Payer: Self-pay | Admitting: Surgery

## 2015-04-14 LAB — CBC
HEMATOCRIT: 41.4 % (ref 40.0–52.0)
Hemoglobin: 14.1 g/dL (ref 13.0–18.0)
MCH: 31.6 pg (ref 26.0–34.0)
MCHC: 34.2 g/dL (ref 32.0–36.0)
MCV: 92.5 fL (ref 80.0–100.0)
Platelets: 260 10*3/uL (ref 150–440)
RBC: 4.47 MIL/uL (ref 4.40–5.90)
RDW: 12.6 % (ref 11.5–14.5)
WBC: 13.8 10*3/uL — AB (ref 3.8–10.6)

## 2015-04-14 NOTE — Progress Notes (Signed)
Patient ID: Ian Pennington, male   DOB: August 03, 1966, 49 y.o.   MRN: [id number]   Surgery  POD 2  S/P drainage periappendiceal abscess  Feels better , no flatus yet, some emesis last night   Filed Vitals:   04/13/15 2324 04/14/15 0500 04/14/15 0755 04/14/15 1533  BP: 126/61  122/64 127/66  Pulse: 77  63 60  Temp: 98.1 F (36.7 C)  97.7 F (36.5 C) 97.7 F (36.5 C)  TempSrc: Oral  Oral Oral  Resp: Height:      Weight:  137.803 kg (303 lb 12.8 oz)    SpO2: 95%  96% 98%    PE: Abdomen is soft. Nontender. Dressings are intact. Jackson-Pratt demonstrates serous fluid.  Labs  CBC Latest Ref Rng 04/14/2015 04/13/2015 04/12/2015  WBC 3.8 - 10.6 K/uL 13.8(H) 14.2(H) 15.4(H)  Hemoglobin 13.0 - 18.0 g/dL 40.9 81.1 91.4  Hematocrit 40.0 - 52.0 % 41.4 42.8 46.8  Platelets 150 - 440 K/uL 260 232 276   CMP Latest Ref Rng 04/12/2015  Glucose 65 - 99 mg/dL 782(N)  BUN 6 - 20 mg/dL 12  Creatinine 5.62 - 1.30 mg/dL 8.65  Sodium 784 - 696 mmol/L 137  Potassium 3.5 - 5.1 mmol/L 4.0  Chloride 101 - 111 mmol/L 100(L)  CO2 22 - 32 mmol/L 25  Calcium 8.9 - 10.3 mg/dL 9.3  Total Protein 6.5 - 8.1 g/dL 8.1  Total Bilirubin 0.3 - 1.2 mg/dL 2.9(B)  Alkaline Phos 38 - 126 U/L 67  AST 15 - 41 U/L 26  ALT 17 - 63 U/L 42     IMP/plan:  He is making slow progress. We will start him on a clear liquid diet in the morning. Continue intravenous antibiotics.

## 2015-04-15 LAB — CBC
HEMATOCRIT: 41 % (ref 40.0–52.0)
HEMOGLOBIN: 14.2 g/dL (ref 13.0–18.0)
MCH: 32.2 pg (ref 26.0–34.0)
MCHC: 34.5 g/dL (ref 32.0–36.0)
MCV: 93.3 fL (ref 80.0–100.0)
PLATELETS: 259 10*3/uL (ref 150–440)
RBC: 4.4 MIL/uL (ref 4.40–5.90)
RDW: 12.3 % (ref 11.5–14.5)
WBC: 10.2 10*3/uL (ref 3.8–10.6)

## 2015-04-15 NOTE — Progress Notes (Signed)
Patient ID: Ian Pennington, male   DOB: 17-Apr-1966, 49 y.o.   MRN: [id number]   POD 3  Passing some gas No BM yet Mild nausea On clears only  Filed Vitals:   04/14/15 2346 04/15/15 0300 04/15/15 0804 04/15/15 1042  BP: 126/69  116/70 114/73  Pulse: 58  63 57  Temp: 97.9 F (36.6 C)  98.2 F (36.8 C) 97.9 F (36.6 C)  TempSrc: Oral  Oral Oral  Resp: Height:      Weight:  136.215 kg (300 lb 4.8 oz)    SpO2: 97%  96% 96%    PE:  abd soft and less distended than yesterday. Jp serosang fluid, non-bilious or feculent.  CBC Latest Ref Rng 04/15/2015 04/14/2015 04/13/2015  WBC 3.8 - 10.6 K/uL 10.2 13.8(H) 14.2(H)  Hemoglobin 13.0 - 18.0 g/dL 40.9 81.1 91.4  Hematocrit 40.0 - 52.0 % 41.0 41.4 42.8  Platelets 150 - 440 K/uL 259 260 232    IMP  Making good progress,  He is hesitant to advance diet.  Cont abx, mobilize perhaps home this weekend.

## 2015-04-15 NOTE — Care Management (Signed)
Patient status post laparoscopic appendectomy converted to open appendectomy.  Patient does not have health insurance or a PCP.  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  Patient states that he does not have a pharmacy that he uses.  Patient states that he should be able to afford his medication at the time of discharge "unless they are really expensive".  [facility], I will provide this to him.  Patient has access to transportation for any required follow up appointments.

## 2015-04-16 LAB — CREATININE, SERUM
CREATININE: 0.95 mg/dL (ref 0.61–1.24)
GFR calc Af Amer: >60 mL/min (ref >60)
GFR calc non Af Amer: >60 mL/min (ref >60)

## 2015-04-16 MED ORDER — AMOXICILLIN-POT CLAVULANATE 875-125 MG PO TABS
1.0000 | ORAL_TABLET | Freq: Two times a day (BID) | ORAL | Status: DC
  Administered 2015-04-16 – 2015-04-17 (×3): 1 via ORAL
  Filled 2015-04-16 (×3): qty 1

## 2015-04-16 NOTE — Progress Notes (Signed)
Patient ID: Ian Pennington, male   DOB: 09-Jun-1966, 49 y.o.   MRN: [id number]   Surgery  POD 4  S/P drainage periappendiceal abscess  He is complaining of mild nausea Chills last night Tolerating regular diet Passing flatus No further emesis.  Filed Vitals:   04/15/15 1042 04/15/15 1657 04/16/15 0103 04/16/15 0834  BP: 114/73 126/71 141/66 126/77  Pulse: 57 69 64 61  Temp: 97.9 F (36.6 C) 98.2 F (36.8 C) 98 F (36.7 C) 97.7 F (36.5 C)  TempSrc: Oral Oral Oral Oral  Resp: 16  20   Height:      Weight:   135.399 kg (298 lb 8 oz)   SpO2: 96% 95% 98% 96%    PE:  His wound is healing nicely. Drainage is clearing up and becoming more serous. There is no pus noted no feculent material or bile.  Labs  CBC Latest Ref Rng 04/15/2015 04/14/2015 04/13/2015  WBC 3.8 - 10.6 K/uL 10.2 13.8(H) 14.2(H)  Hemoglobin 13.0 - 18.0 g/dL 40.9 81.1 91.4  Hematocrit 40.0 - 52.0 % 41.0 41.4 42.8  Platelets 150 - 440 K/uL 259 260 232   CMP Latest Ref Rng 04/16/2015 04/12/2015  Glucose 65 - 99 mg/dL - 782(N)  BUN 6 - 20 mg/dL - 12  Creatinine 5.62 - 1.24 mg/dL 1.30 8.65  Sodium 784 - 145 mmol/L - 137  Potassium 3.5 - 5.1 mmol/L - 4.0  Chloride 101 - 111 mmol/L - 100(L)  CO2 22 - 32 mmol/L - 25  Calcium 8.9 - 10.3 mg/dL - 9.3  Total Protein 6.5 - 8.1 g/dL - 8.1  Total Bilirubin 0.3 - 1.2 mg/dL - 6.9(G)  Alkaline Phos 38 - 126 U/L - 67  AST 15 - 41 U/L - 26  ALT 17 - 63 U/L - 42     IMP stable postoperative day #4.  Plan continue intravenous antibiotics. The patient may ambulate.  Tentative discharge the next 24 hours.

## 2015-04-17 DIAGNOSIS — K3533 Acute appendicitis with perforation and localized peritonitis, with abscess: Secondary | ICD-10-CM

## 2015-04-17 MED ORDER — HYDROCODONE-ACETAMINOPHEN 5-325 MG PO TABS: 1.0000 | ORAL_TABLET | Freq: Four times a day (QID) | ORAL | Status: DC | PRN

## 2015-04-17 MED ORDER — AMOXICILLIN-POT CLAVULANATE 875-125 MG PO TABS: 1.0000 | ORAL_TABLET | Freq: Two times a day (BID) | ORAL | Status: DC

## 2015-04-17 NOTE — Progress Notes (Signed)
Pt d/c to home today w/JP drain intact.  Pt was given education and demonstration on how to open and close drain.  Pt was also given education on how to record output including colleting output amount and color.  Pt was able to demonstrate back to RN how to successfully empty and record drainage.  IV removed intact.  Pt d/c instructions reviewed and all questions and concerns addressed.  Rx printed and given to pt w/all questions and concerns addressed.  Education printed on D/C paperwork and given to pt.  Pt was educated on the signs and symptoms of infection.  Pt was able to verbalize what to assess for while at home to help prevent risk of infection.  Pt was escorted by family to home.

## 2015-04-17 NOTE — Discharge Summary (Signed)
Physician Discharge Summary  Patient ID: Ian Pennington MRN: [id number] DOB/AGE: [birth year] 49 y.o.  Admit date: 04/12/2015 Discharge date: 04/17/2015  Admission Diagnoses: Peri-appendiceal abscess, acute appendicitis.  Discharge Diagnoses:  Same.  Discharged Condition: stable and improved.  Hospital Course: admitted following open drainage peri-appendiceal abscess.  Treated with IV antibiotics and then transitioned to oral antibiotics.  Taught JP care and recording.  Post-op ileus resolved and diet advanced.  Discharged home in stable condition with JP in place.  Plan total 14 days antibotics and remove JP in office next week.   Discharge Exam: Blood pressure 121/75, pulse 57, temperature 97.5 F (36.4 C), temperature source Oral, resp. rate 18, height  (1.93 m), weight 132.995 kg (293 lb 3.2 oz), SpO2 98 %.  abd soft, inc clean and dry, JP non-purulent fluid.  Disposition:   Home with self care. SignedNatale Lay 04/17/2015, 10:18 AM

## 2015-04-17 NOTE — Progress Notes (Signed)
Patient ID: Ian Pennington, male   DOB: October 03, 1965, 49 y.o.   MRN: [id number]   Surgery  POD 5  S/P open drainage of periappendicieal abscess  He is without complaints. He tolerated regular diet. He denies any nausea vomiting. He had a large bowel movement and passage of gas this morning.    He has been taught Jackson-Pratt drainage.  Filed Vitals:   04/16/15 1643 04/17/15 0034 04/17/15 0655 04/17/15 0751  BP: 133/82 132/74  121/75  Pulse: 70 58  57  Temp: 98 F (36.7 C) 98.5 F (36.9 C)  97.5 F (36.4 C)  TempSrc: Oral Oral  Oral  Resp:  18    Height:      Weight:   132.995 kg (293 lb 3.2 oz)   SpO2: 98% 97%  98%    PE:  He is alert and oriented. He is in no distress. Lungs are clear. Abdomen is soft. Wounds are healing nicely. Drainage is serous.   IMP he is stable. He is doing well. He is stable for discharge.  Plan discharge home. Oral Augmentin. Follow-up with me on Wednesday of this week for drain removal. He is in agreement.

## 2015-04-17 NOTE — Discharge Instructions (Signed)
Appendicitis °Appendicitis is when the appendix is swollen (inflamed). The inflammation can lead to developing a hole (perforation) and a collection of pus (abscess). °CAUSES  °There is not always an obvious cause of appendicitis. Sometimes it is caused by an obstruction in the appendix. The obstruction can be caused by: °· A small, hard, pea-sized ball of stool (fecalith). °· Enlarged lymph glands in the appendix. °SYMPTOMS  °· Pain around your belly button (navel) that moves toward your lower right belly (abdomen). The pain can become more severe and sharp as time passes. °· Tenderness in the lower right abdomen. Pain gets worse if you cough or make a sudden movement. °· Feeling sick to your stomach (nauseous). °· Throwing up (vomiting). °· Loss of appetite. °· Fever. °· Constipation. °· Diarrhea. °· Generally not feeling well. °DIAGNOSIS  °· Physical exam. °· Blood tests. °· Urine test. °· X-rays or a CT scan may confirm the diagnosis. °TREATMENT  °Once the diagnosis of appendicitis is made, the most common treatment is to remove the appendix as soon as possible. This procedure is called appendectomy. In an open appendectomy, a cut (incision) is made in the lower right abdomen and the appendix is removed. In a laparoscopic appendectomy, usually 3 small incisions are made. Long, thin instruments and a camera tube are used to remove the appendix. Most patients go home in 24 to 48 hours after appendectomy. °In some situations, the appendix may have already perforated and an abscess may have formed. The abscess may have a "wall" around it as seen on a CT scan. In this case, a drain may be placed into the abscess to remove fluid, and you may be treated with antibiotic medicines that kill germs. The medicine is given through a tube in your vein (IV). Once the abscess has resolved, it may or may not be necessary to have an appendectomy. You may need to stay in the hospital longer than 48 hours. °Document Released:  08/27/2005 Document Revised: 02/26/2012 Document Reviewed: 11/22/2009 °ExitCare® Patient Information ©2015 [employer]. This information is not intended to replace advice given to you by your health care provider. Make sure you discuss any questions you have with your health care provider. ° °

## 2015-04-20 ENCOUNTER — Encounter: Payer: Self-pay | Admitting: Surgery

## 2015-04-20 ENCOUNTER — Ambulatory Visit (INDEPENDENT_AMBULATORY_CARE_PROVIDER_SITE_OTHER): Payer: Self-pay | Admitting: Surgery

## 2015-04-20 VITALS — BP 155/82 | HR 87 | Temp 98.0°F | Ht 76.0 in | Wt 303.6 lb

## 2015-04-20 DIAGNOSIS — K353 Acute appendicitis with localized peritonitis, without perforation or gangrene: Secondary | ICD-10-CM

## 2015-04-20 DIAGNOSIS — K3533 Acute appendicitis with perforation and localized peritonitis, with abscess: Secondary | ICD-10-CM

## 2015-04-20 NOTE — Patient Instructions (Signed)
Follow-up in 2 weeks with Dr. Egbert Garibaldi in the [facility] office.  You may shower. THe steri-strips will fall off in 7-10 days on their own.  If you develop severe pain, nausea/vomiting, or fever prior to your appointment please call the office and ask to speak with a nurse.  We will discuss you returning to work at your next appointment. You may not return prior to your next appointment.

## 2015-04-20 NOTE — Progress Notes (Signed)
Surgery clinic   The patient is status post open drainage of periappendiceal abscess.   He was discharged home with Jackson-Pratt drain. This is had minimal nonpurulent output.   He is feeling much better. He is almost finished with his oral antibiotics. He is eating well. Pain is well-controlled. No wound drainage.   Physical examination Jackson-Pratt drain was removed uneventfully. Staples were removed and Steri-Strips were applied. The wound is healing nicely no signs of infection.   Impression:  doing well status post drainage of periappendiceal abscess.   Plan I will follow him up in 2 weeks in the [facility] office location. At that time I think he'll be back to work full-time. He did not require any work note or narcotics prescription today.

## 2015-05-02 ENCOUNTER — Encounter: Payer: Self-pay | Admitting: Surgery

## 2015-05-02 ENCOUNTER — Ambulatory Visit (INDEPENDENT_AMBULATORY_CARE_PROVIDER_SITE_OTHER): Payer: Self-pay | Admitting: Surgery

## 2015-05-02 VITALS — BP 158/80 | HR 64 | Temp 98.1°F | Ht 76.0 in | Wt 300.0 lb

## 2015-05-02 DIAGNOSIS — K353 Acute appendicitis with localized peritonitis, without perforation or gangrene: Secondary | ICD-10-CM

## 2015-05-02 DIAGNOSIS — K3533 Acute appendicitis with perforation and localized peritonitis, with abscess: Secondary | ICD-10-CM

## 2015-05-02 MED ORDER — HYDROCODONE-ACETAMINOPHEN 5-325 MG PO TABS: 1.0000 | ORAL_TABLET | Freq: Four times a day (QID) | ORAL | Status: DC | PRN

## 2015-05-02 NOTE — Patient Instructions (Signed)
No follow-up needed. Please call our office with any questions or concerns.

## 2015-05-02 NOTE — Progress Notes (Signed)
Surgery clinic  The patient is proximal to 3 weeks status post open drainage of a periappendiceal abscess. He is without complaints. He does have some complaints of pain at night. 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  There is been no fevers. Appetite is been good. Bowel movements are normal.  Filed Vitals:   05/02/15 1029  BP: 158/80  Pulse: 64  Temp: 98.1 F (36.7 C)     his abdomen is soft and nontender incisions have healed nicely. No Sign of an incisional hernia.  Impression doing well 3 weeks postop.  Plan I recommended to him a total of 6 weeks of light duty. After this had like to see him back as needed and he can increase his activity as tolerated. A refill on Norco was provided #30.

## 2017-04-15 IMAGING — CT CT ABD-PELV W/ CM
2 of 5 series · 16 of 46 positions shown, 18 images · IV contrast (omnipaque)
Comparison: 04/12/2015 ultrasound. Report of CT abdomen pelvis
02/19/2006.

CLINICAL DATA: 49-year-old male with right lower quadrant pain,
fever and nausea with vomiting. Initial encounter.

EXAM:
CT ABDOMEN AND PELVIS WITH CONTRAST
TECHNIQUE: Multidetector CT imaging of the abdomen and pelvis was performed
using the standard protocol following bolus administration of
intravenous contrast.
CONTRAST:  125mL OMNIPAQUE IOHEXOL 300 MG/ML  SOLN

[Series 2: routine abd pel with · axial · 0.90mm/px · z∈[-1173,-703]mm · 13 of 106 slices shown, 15 images]
[im 6/106  soft-tissue]
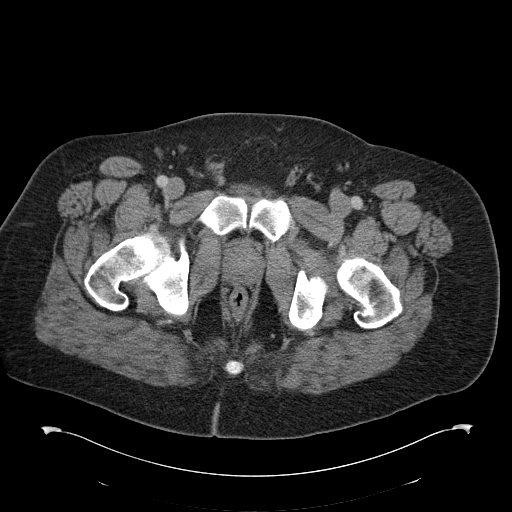
[im 6/106  bone]
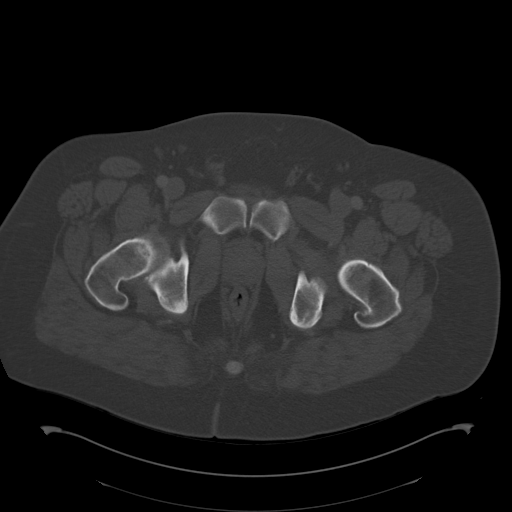
[im 17/106  soft-tissue]
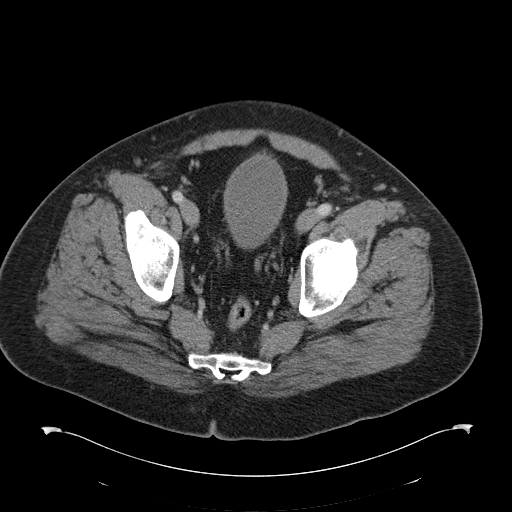
[im 23/106  soft-tissue]
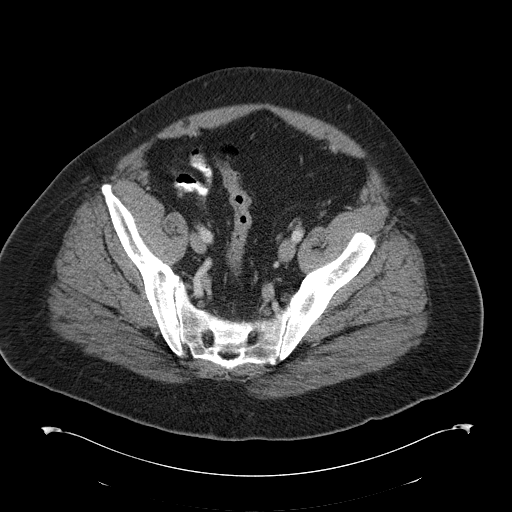
[im 28/106  soft-tissue]
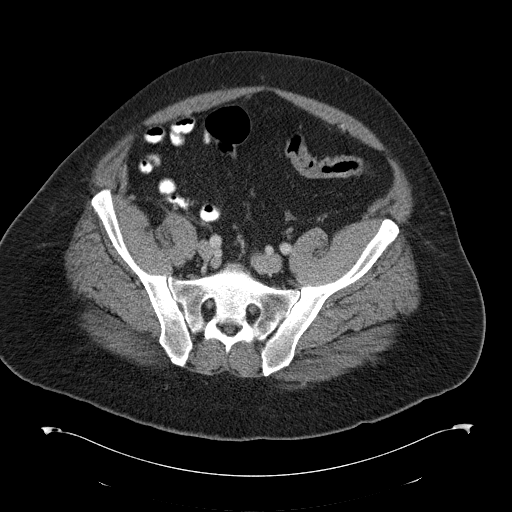
[im 39/106  soft-tissue]
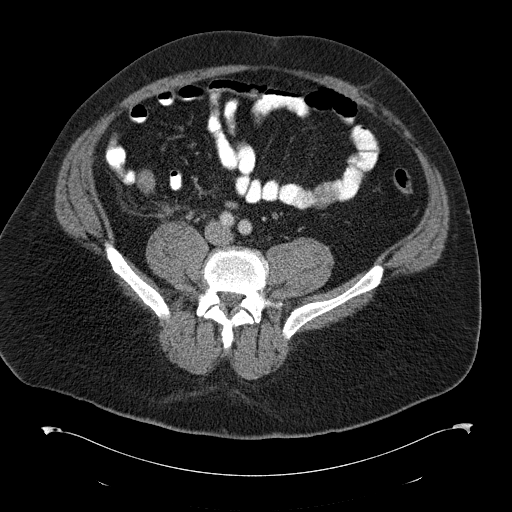
[im 45/106  soft-tissue]
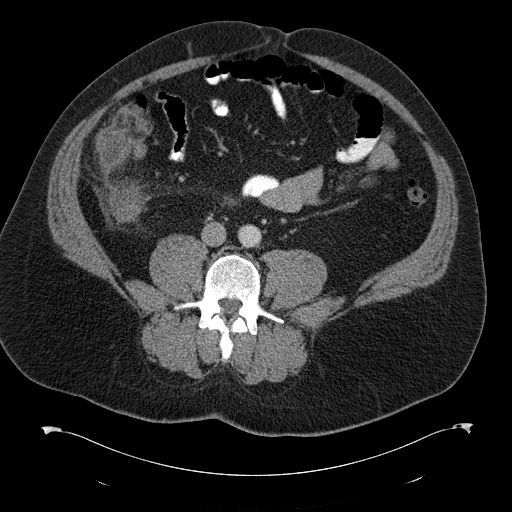
[im 56/106  soft-tissue]
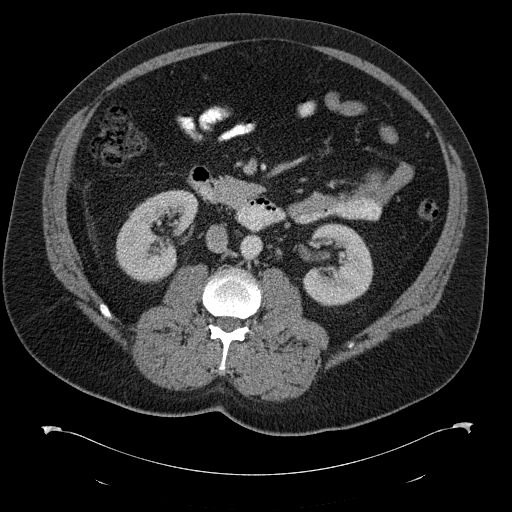
[im 61/106  soft-tissue]
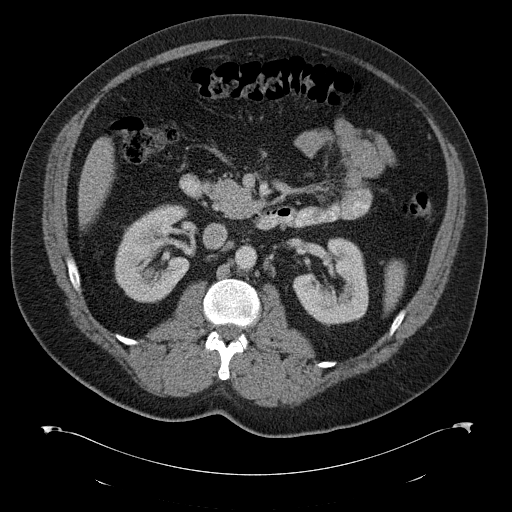
[im 67/106  soft-tissue]
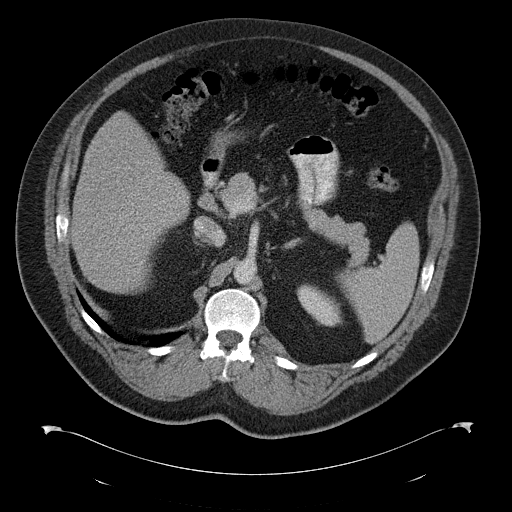
[im 67/106  bone]
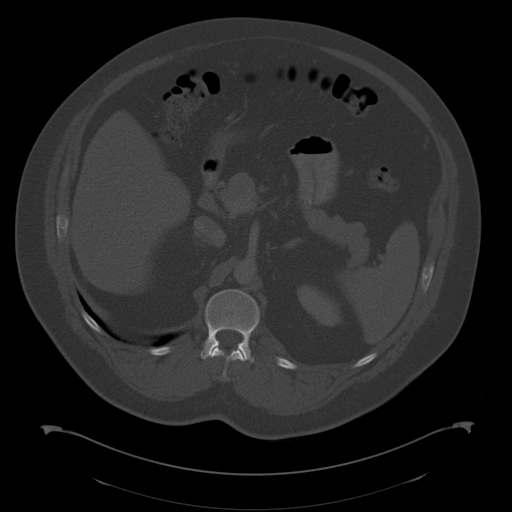
[im 78/106  soft-tissue]
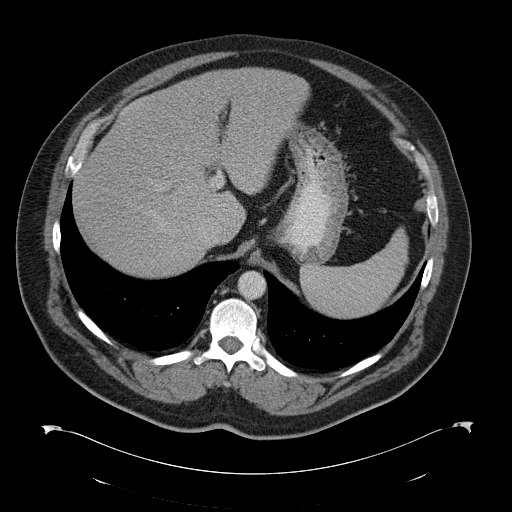
[im 83/106  soft-tissue]
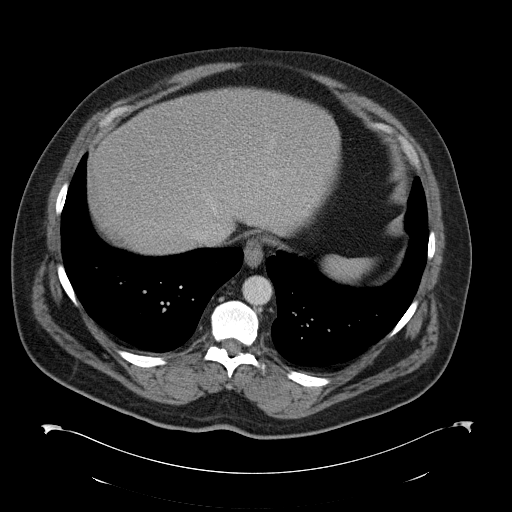
[im 89/106  soft-tissue]
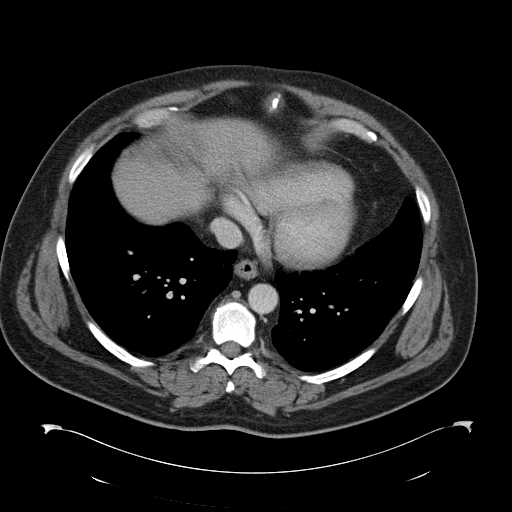
[im 100/106  soft-tissue]
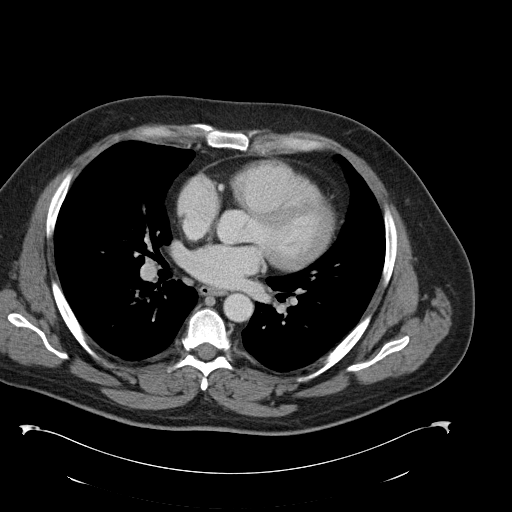

[Series 5: cor routine abd pel with · coronal · 0.85mm/px · 3 of 176 slices shown]
[im 59/176  soft-tissue]
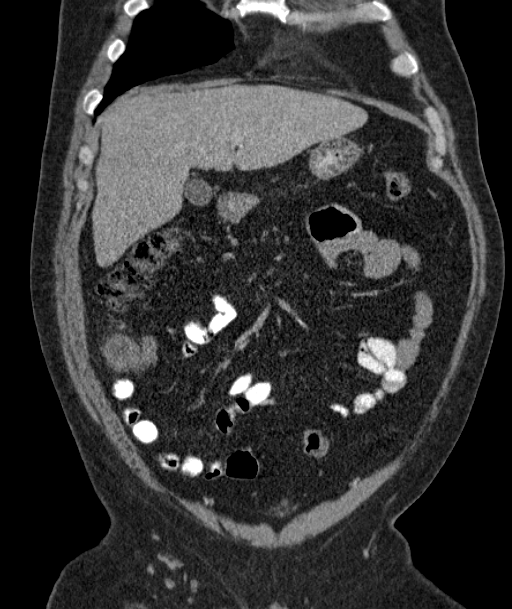
[im 78/176  soft-tissue]
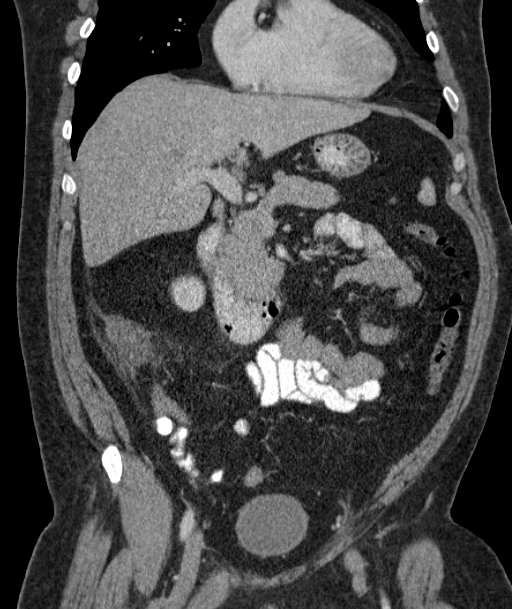
[im 98/176  soft-tissue]
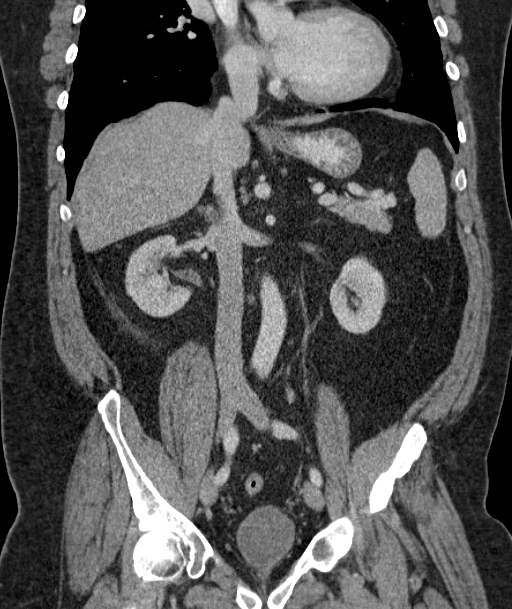

[16 of 46 positions shown; findings below may reference images not displayed]

FINDINGS: Diffuse enlargement and inflammation of retrocecal appendix with
adjacent inflammation of surrounding fat planes consistent with
appendicitis which may have ruptured. No free intraperitoneal air.

Fatty infiltration of the liver. Slightly lobulated contour without
other findings of cirrhosis. No calcified gallstones.

No splenic, pancreatic, renal or right adrenal lesion. Left adrenal
2.7 x 1.9 x 1.8 cm low-density structure possibly adenoma but not
able to be confirmed as such on the present exam. This can be
evaluated with CT of the abdomen without contrast after appendicitis
has been treated.

Lung bases clear. Heart size within normal limits. Small
calcifications adjacent to the right coronary artery.

No abdominal aortic aneurysm. Minimal calcification left iliac
artery.

Scattered colonic diverticula.

Noncontrast filled views of the urinary bladder unremarkable.
Prostate does not appear enlarged.

Portacaval lymph nodes slightly prominent size measuring up to
cm short axis dimension. Top-normal size peripancreatic lymph nodes.
Etiology/significance indeterminate.

Mild degenerative changes L3-4 and L4-5.
IMPRESSION: Diffuse enlargement and inflammation of retrocecal appendix with
adjacent inflammation of surrounding fat planes consistent with
appendicitis which may have ruptured.

Fatty infiltration of the liver. Slightly lobulated contour without
other findings of cirrhosis.

Left adrenal 2.7 x 1.9 x 1.8 cm low-density structure possibly an
adenoma but not able to be confirmed as such on the present exam.
This can be evaluated with CT of the abdomen without contrast after
appendicitis has been treated.

Portacaval lymph node is slightly prominent in size measuring up to
1.3 cm short axis dimension. Top-normal size peripancreatic lymph
nodes. Etiology/significance indeterminate.

These results were called by telephone at the time of interpretation
on 04/12/2015 at [DATE] to Dr. MLAKI NEDAL , who verbally
acknowledged these results.

## 2019-09-05 ENCOUNTER — Emergency Department: Payer: HRSA Program

## 2019-09-05 ENCOUNTER — Encounter: Payer: Self-pay | Admitting: Internal Medicine

## 2019-09-05 ENCOUNTER — Inpatient Hospital Stay
Admission: EM | Admit: 2019-09-05 | Discharge: 2019-09-12 | DRG: 177 | Disposition: A | Discharge status: Home Health | Payer: HRSA Program | Attending: Internal Medicine | Admitting: Internal Medicine

## 2019-09-05 ENCOUNTER — Other Ambulatory Visit: Payer: Self-pay

## 2019-09-05 DIAGNOSIS — T380X5A Adverse effect of glucocorticoids and synthetic analogues, initial encounter: Secondary | ICD-10-CM | POA: Diagnosis not present

## 2019-09-05 DIAGNOSIS — Y92239 Unspecified place in hospital as the place of occurrence of the external cause: Secondary | ICD-10-CM | POA: Diagnosis not present

## 2019-09-05 DIAGNOSIS — Z833 Family history of diabetes mellitus: Secondary | ICD-10-CM

## 2019-09-05 DIAGNOSIS — Z23 Encounter for immunization: Secondary | ICD-10-CM | POA: Diagnosis not present

## 2019-09-05 DIAGNOSIS — R030 Elevated blood-pressure reading, without diagnosis of hypertension: Secondary | ICD-10-CM | POA: Diagnosis present

## 2019-09-05 DIAGNOSIS — Z79891 Long term (current) use of opiate analgesic: Secondary | ICD-10-CM | POA: Diagnosis not present

## 2019-09-05 DIAGNOSIS — J1282 Pneumonia due to coronavirus disease 2019: Secondary | ICD-10-CM | POA: Diagnosis present

## 2019-09-05 DIAGNOSIS — Z8349 Family history of other endocrine, nutritional and metabolic diseases: Secondary | ICD-10-CM | POA: Diagnosis not present

## 2019-09-05 DIAGNOSIS — Z5329 Procedure and treatment not carried out because of patient's decision for other reasons: Secondary | ICD-10-CM | POA: Diagnosis present

## 2019-09-05 DIAGNOSIS — Z87891 Personal history of nicotine dependence: Secondary | ICD-10-CM | POA: Diagnosis not present

## 2019-09-05 DIAGNOSIS — E1165 Type 2 diabetes mellitus with hyperglycemia: Secondary | ICD-10-CM | POA: Diagnosis not present

## 2019-09-05 DIAGNOSIS — E662 Morbid (severe) obesity with alveolar hypoventilation: Secondary | ICD-10-CM | POA: Diagnosis present

## 2019-09-05 DIAGNOSIS — Z9049 Acquired absence of other specified parts of digestive tract: Secondary | ICD-10-CM

## 2019-09-05 DIAGNOSIS — E876 Hypokalemia: Secondary | ICD-10-CM | POA: Diagnosis present

## 2019-09-05 DIAGNOSIS — J1289 Other viral pneumonia: Secondary | ICD-10-CM

## 2019-09-05 DIAGNOSIS — U071 COVID-19: Secondary | ICD-10-CM | POA: Diagnosis present

## 2019-09-05 DIAGNOSIS — J9601 Acute respiratory failure with hypoxia: Secondary | ICD-10-CM | POA: Diagnosis present

## 2019-09-05 LAB — COMPREHENSIVE METABOLIC PANEL
ALT: 31 U/L (ref 0–44)
AST: 41 U/L (ref 15–41)
Albumin: 3.1 g/dL — ABNORMAL LOW (ref 3.5–5.0)
Alkaline Phosphatase: 55 U/L (ref 38–126)
Anion gap: 13 (ref 5–15)
BUN: 18 mg/dL (ref 6–20)
CO2: 24 mmol/L (ref 22–32)
Calcium: 8.7 mg/dL — ABNORMAL LOW (ref 8.9–10.3)
Chloride: 100 mmol/L (ref 98–111)
Creatinine, Ser: 0.78 mg/dL (ref 0.61–1.24)
GFR calc Af Amer: >60 mL/min (ref >60)
GFR calc non Af Amer: >60 mL/min (ref >60)
Glucose, Bld: 140 mg/dL — ABNORMAL HIGH (ref 70–99)
Potassium: 3.4 mmol/L — ABNORMAL LOW (ref 3.5–5.1)
Sodium: 137 mmol/L (ref 135–145)
Total Bilirubin: 1.3 mg/dL — ABNORMAL HIGH (ref 0.3–1.2)
Total Protein: 7.5 g/dL (ref 6.5–8.1)

## 2019-09-05 LAB — CBC WITH DIFFERENTIAL/PLATELET
Abs Immature Granulocytes: 0.09 10*3/uL — ABNORMAL HIGH (ref 0.00–0.07)
Basophils Absolute: 0 10*3/uL (ref 0.0–0.1)
Basophils Relative: 0 %
Eosinophils Absolute: 0 10*3/uL (ref 0.0–0.5)
Eosinophils Relative: 0 %
HCT: 44 % (ref 39.0–52.0)
Hemoglobin: 16 g/dL (ref 13.0–17.0)
Immature Granulocytes: 1 %
Lymphocytes Relative: 13 %
Lymphs Abs: 1.2 10*3/uL (ref 0.7–4.0)
MCH: 30.3 pg (ref 26.0–34.0)
MCHC: 36.4 g/dL — ABNORMAL HIGH (ref 30.0–36.0)
MCV: 83.3 fL (ref 80.0–100.0)
Monocytes Absolute: 0.3 10*3/uL (ref 0.1–1.0)
Monocytes Relative: 4 %
Neutro Abs: 7.7 10*3/uL (ref 1.7–7.7)
Neutrophils Relative %: 82 %
Platelets: 323 10*3/uL (ref 150–400)
RBC: 5.28 MIL/uL (ref 4.22–5.81)
RDW: 11.9 % (ref 11.5–15.5)
WBC: 9.3 10*3/uL (ref 4.0–10.5)
nRBC: 0 % (ref 0.0–0.2)

## 2019-09-05 LAB — PROCALCITONIN: Procalcitonin: 0.12 ng/mL

## 2019-09-05 LAB — TRIGLYCERIDES: Triglycerides: 127 mg/dL (ref <150)

## 2019-09-05 LAB — LACTATE DEHYDROGENASE: LDH: 578 U/L — ABNORMAL HIGH (ref 98–192)

## 2019-09-05 LAB — POC SARS CORONAVIRUS 2 AG: SARS Coronavirus 2 Ag: POSITIVE — AB

## 2019-09-05 LAB — SAMPLE TO BLOOD BANK

## 2019-09-05 LAB — C-REACTIVE PROTEIN: CRP: 7.2 mg/dL — ABNORMAL HIGH (ref <1.0)

## 2019-09-05 LAB — FIBRINOGEN: Fibrinogen: >750 mg/dL — ABNORMAL HIGH (ref 210–475)

## 2019-09-05 LAB — ABO/RH: ABO/RH(D): A NEG

## 2019-09-05 LAB — FIBRIN DERIVATIVES D-DIMER (ARMC ONLY): Fibrin derivatives D-dimer (ARMC): 1010.04 ng/mL (FEU) — ABNORMAL HIGH (ref 0.00–499.00)

## 2019-09-05 LAB — LACTIC ACID, PLASMA: Lactic Acid, Venous: 1.8 mmol/L (ref 0.5–1.9)

## 2019-09-05 LAB — FERRITIN: Ferritin: 838 ng/mL — ABNORMAL HIGH (ref 24–336)

## 2019-09-05 MED ORDER — ALBUTEROL SULFATE HFA 108 (90 BASE) MCG/ACT IN AERS
2.0000 | INHALATION_SPRAY | Freq: Four times a day (QID) | RESPIRATORY_TRACT | Status: DC
  Administered 2019-09-05 – 2019-09-12 (×30): 2 via RESPIRATORY_TRACT
  Filled 2019-09-05: qty 6.7

## 2019-09-05 MED ORDER — OXYCODONE-ACETAMINOPHEN 5-325 MG PO TABS
1.0000 | ORAL_TABLET | Freq: Once | ORAL | Status: AC
  Administered 2019-09-05: 1 via ORAL
  Filled 2019-09-05: qty 1

## 2019-09-05 MED ORDER — SODIUM CHLORIDE 0.9 % IV SOLN
100.0000 mg | Freq: Every day | INTRAVENOUS | Status: AC
  Administered 2019-09-06 – 2019-09-09 (×4): 100 mg via INTRAVENOUS
  Filled 2019-09-05 (×4): qty 100

## 2019-09-05 MED ORDER — ASCORBIC ACID 500 MG PO TABS
500.0000 mg | ORAL_TABLET | Freq: Every day | ORAL | Status: DC
  Administered 2019-09-05 – 2019-09-12 (×8): 500 mg via ORAL
  Filled 2019-09-05 (×8): qty 1

## 2019-09-05 MED ORDER — GUAIFENESIN-DM 100-10 MG/5ML PO SYRP
10.0000 mL | ORAL_SOLUTION | ORAL | Status: DC | PRN
  Filled 2019-09-05: qty 10

## 2019-09-05 MED ORDER — SENNOSIDES-DOCUSATE SODIUM 8.6-50 MG PO TABS: 1.0000 | ORAL_TABLET | Freq: Every evening | ORAL | Status: DC | PRN

## 2019-09-05 MED ORDER — ENOXAPARIN SODIUM 40 MG/0.4ML ~~LOC~~ SOLN
40.0000 mg | SUBCUTANEOUS | Status: DC
  Administered 2019-09-05 – 2019-09-08 (×4): 40 mg via SUBCUTANEOUS
  Filled 2019-09-05 (×4): qty 0.4

## 2019-09-05 MED ORDER — ONDANSETRON HCL 4 MG/2ML IJ SOLN: 4.0000 mg | Freq: Four times a day (QID) | INTRAMUSCULAR | Status: DC | PRN

## 2019-09-05 MED ORDER — SODIUM CHLORIDE 0.9 % IV SOLN: 100.0000 mg | Freq: Every day | INTRAVENOUS | Status: DC

## 2019-09-05 MED ORDER — ALBUTEROL SULFATE HFA 108 (90 BASE) MCG/ACT IN AERS: 2.0000 | INHALATION_SPRAY | Freq: Once | RESPIRATORY_TRACT | Status: DC

## 2019-09-05 MED ORDER — SODIUM CHLORIDE 0.9 % IV SOLN: 200.0000 mg | Freq: Once | INTRAVENOUS | Status: DC

## 2019-09-05 MED ORDER — ZINC SULFATE 220 (50 ZN) MG PO CAPS
220.0000 mg | ORAL_CAPSULE | Freq: Every day | ORAL | Status: DC
  Administered 2019-09-05 – 2019-09-12 (×8): 220 mg via ORAL
  Filled 2019-09-05 (×8): qty 1

## 2019-09-05 MED ORDER — METHYLPREDNISOLONE SODIUM SUCC 125 MG IJ SOLR
0.5000 mg/kg | Freq: Two times a day (BID) | INTRAMUSCULAR | Status: DC
  Administered 2019-09-05 – 2019-09-12 (×15): 62.5 mg via INTRAVENOUS
  Filled 2019-09-05 (×13): qty 2

## 2019-09-05 MED ORDER — SODIUM CHLORIDE 0.9 % IV SOLN
500.0000 mg | INTRAVENOUS | Status: DC
  Administered 2019-09-05: 500 mg via INTRAVENOUS
  Filled 2019-09-05: qty 500

## 2019-09-05 MED ORDER — SODIUM CHLORIDE 0.9 % IV SOLN
200.0000 mg | Freq: Once | INTRAVENOUS | Status: AC
  Administered 2019-09-05: 200 mg via INTRAVENOUS
  Filled 2019-09-05: qty 200

## 2019-09-05 MED ORDER — ONDANSETRON HCL 4 MG PO TABS: 4.0000 mg | ORAL_TABLET | Freq: Four times a day (QID) | ORAL | Status: DC | PRN

## 2019-09-05 MED ORDER — SODIUM CHLORIDE 0.9 % IV SOLN
2.0000 g | INTRAVENOUS | Status: DC
  Administered 2019-09-05: 02:00:00 2 g via INTRAVENOUS
  Filled 2019-09-05: qty 20

## 2019-09-05 MED ORDER — ADULT MULTIVITAMIN W/MINERALS CH
1.0000 | ORAL_TABLET | Freq: Every day | ORAL | Status: DC
  Administered 2019-09-05 – 2019-09-12 (×8): 1 via ORAL
  Filled 2019-09-05 (×8): qty 1

## 2019-09-05 MED ORDER — DEXAMETHASONE SODIUM PHOSPHATE 10 MG/ML IJ SOLN
10.0000 mg | Freq: Once | INTRAMUSCULAR | Status: AC
  Administered 2019-09-05: 02:00:00 10 mg via INTRAVENOUS
  Filled 2019-09-05: qty 1

## 2019-09-05 NOTE — ED Notes (Signed)
Pt tolerating po food and drink. Pt ate 90% of dinner, pt given more water and soda to drink

## 2019-09-05 NOTE — ED Notes (Signed)
Pt alert, watching TV, no apparent distress. Pt able to converse freely with minimal SOB. Pt remains on high flow O2 at 12L/min. Pt tolerating po well.

## 2019-09-05 NOTE — ED Notes (Signed)
hospitalist in to see pt.

## 2019-09-05 NOTE — ED Notes (Signed)
Patients cell phone charger plugged into wall and phone

## 2019-09-05 NOTE — ED Notes (Addendum)
Pt gives verbal consent to discuss care and results with the following people: Harlon Flor, [address] Son Romelle Starcher, Mother

## 2019-09-05 NOTE — ED Notes (Signed)
Patient placed on 7L O2 by Enzo Bi, MD. Also reported to this RN patient will be staying here and not going to [facility]

## 2019-09-05 NOTE — ED Triage Notes (Signed)
Pt from home with shob, covid positive for a week per pt. Pt arrives with pox on 6lpm of 85%, 66% on ra per ems on their arrival. Pox on ra on arrival here 70%. Pt complains of chest pain. Pt placed on 15lpm non rebreather in room. Pox up to 89%.

## 2019-09-05 NOTE — ED Notes (Signed)
Pt conversing freely, using accessory muscles to breathe at times.

## 2019-09-05 NOTE — ED Notes (Signed)
Pt placed on hi flow nasal cannula by RT.

## 2019-09-05 NOTE — ED Notes (Signed)
Pt desat to 77% on the current high flow level of 7. Pt states he feels like he needs to blow his nose. Pt cleared his sinus. Pt sats then dipped into the 60s. Pt encouraged to deep breathe. Sats now back to 82%. O2 increased to 10L. Sats increased to 88%. O2 increased to 12L. Pt sats now 94%. Admitting MD updated.

## 2019-09-05 NOTE — H&P (Signed)
History and Physical    Ian Pennington BTD:[id number] DOB: Jan 19, 1966 DOA: 09/05/2019  PCP: Patient, No Pcp Per  Patient coming from: home  I have personally briefly reviewed patient's old medical records in [facility]  Chief Complaint: Shortness of breath, Covid positive HPI: Ian Pennington is a 53 y.o. male with no significant past medical history who tested positive for Covid a week prior who presented to the emergency room EMS with 10-day history of shortness of breath has been progressively worsening .both parents also have Covid.  Arrival of EMS, O2 sat was 66% on room air.  He arrived on 6 L O2 satting at 85% and was placed on high flow nasal cannula at 15 L sats improving to the high 90s. Tested again positive for Covid, he had elevation of all his inflammatory biomarkers .  Chest x-ray Diffuse bilateral airspace opacities, likely multifocal pneumonia   Review of Systems: As per HPI otherwise 10 point review of systems negative.    Past Medical History:  Diagnosis Date  . Allergy     Past Surgical History:  Procedure Laterality Date  . APPENDECTOMY  04/12/2015   Procedure: APPENDECTOMY;  Surgeon: Natale Lay, MD;  Location: [facility] ORS;  Service: General;;  laparoscopic appendectomy converted to open appendectomy     reports that he quit smoking about 10 years ago. He has never used smokeless tobacco. He reports that he does not drink alcohol or use drugs.  No Known Allergies  Family History  Problem Relation Age of Onset  . Diabetes Mother   . Hyperlipidemia Father      Prior to Admission medications   Medication Sig Start Date End Date Taking? Authorizing Provider  HYDROcodone-acetaminophen (NORCO) 5-325 MG per tablet Take 1 tablet by mouth every 6 (six) hours as needed for moderate pain. 05/02/15   Natale Lay, MD    Physical Exam: Vitals:   09/05/19 0100 09/05/19 0115 09/05/19 0130 09/05/19 0145  BP:      Pulse: 96 89 92 95  Resp: (!) 21 19 18 19   Temp:       TempSrc:      SpO2: 97% 93% 96% 95%  Weight:      Height:         Vitals:   09/05/19 0100 09/05/19 0115 09/05/19 0130 09/05/19 0145  BP:      Pulse: 96 89 92 95  Resp: (!) 21 19 18 19   Temp:      TempSrc:      SpO2: 97% 93% 96% 95%  Weight:      Height:        Constitutional: Patient with mild conversational dyspnea.  Eyes: PERRL, lids and conjunctivae normal ENMT: Mucous membranes are moist.Neck: normal, supple, no masses, no thyromegaly Respiratory: tachypneic . Able to speak in full sentences. Few scattered wheezes Cardiovascular: Regular rate and rhythm, no murmurs / rubs / gallops. No extremity edema.   Abdomen: obese,no tenderness, no masses palpated. No hepatosplenomegaly. Bowel sounds positive.  Musculoskeletal: no clubbing / cyanosis. No joint deformity upper and lower extremities. G Skin: no rashes, lesions, ulcers. No induration Neurologic: grossly intact.   Psychiatric: Normal judgment and insight. Alert and oriented x 3. Normal mood.    Labs on Admission: I have personally reviewed following labs and imaging studies  CBC: Recent Labs  Lab 09/05/19 0033  WBC 9.3  NEUTROABS 7.7  HGB 16.0  HCT 44.0  MCV 83.3  PLT 323   Basic Metabolic Panel:  Recent Labs  Lab 09/05/19 0033  NA 137  K 3.4*  CL 100  CO2 24  GLUCOSE 140*  BUN 18  CREATININE 0.78  CALCIUM 8.7*   GFR: Estimated Creatinine Clearance: 150.5 mL/min (by C-G formula based on SCr of 0.78 mg/dL). Liver Function Tests: Recent Labs  Lab 09/05/19 0033  AST 41  ALT 31  ALKPHOS 55  BILITOT 1.3*  PROT 7.5  ALBUMIN 3.1*   No results for input(s): LIPASE, AMYLASE in the last 168 hours. No results for input(s): AMMONIA in the last 168 hours. Coagulation Profile: No results for input(s): INR, PROTIME in the last 168 hours. Cardiac Enzymes: No results for input(s): CKTOTAL, CKMB, CKMBINDEX, TROPONINI in the last 168 hours. BNP (last 3 results) No results for input(s): PROBNP in the  last 8760 hours. HbA1C: No results for input(s): HGBA1C in the last 72 hours. CBG: No results for input(s): GLUCAP in the last 168 hours. Lipid Profile: Recent Labs    09/05/19 0033  TRIG 127   Thyroid Function Tests: No results for input(s): TSH, T4TOTAL, FREET4, T3FREE, THYROIDAB in the last 72 hours. Anemia Panel: Recent Labs    09/05/19 0033  FERRITIN 838*   Urine analysis:    Component Value Date/Time   COLORURINE AMBER (A) 04/12/2015 0718   APPEARANCEUR CLEAR (A) 04/12/2015 0718   LABSPEC 1.021 04/12/2015 0718   PHURINE 6.0 04/12/2015 0718   GLUCOSEU NEGATIVE 04/12/2015 0718   HGBUR NEGATIVE 04/12/2015 0718   BILIRUBINUR NEGATIVE 04/12/2015 0718   KETONESUR NEGATIVE 04/12/2015 0718   PROTEINUR 30 (A) 04/12/2015 0718   NITRITE NEGATIVE 04/12/2015 0718   LEUKOCYTESUR TRACE (A) 04/12/2015 0718    Radiological Exams on Admission: DG Chest Port 1 View  Result Date: 09/05/2019 CLINICAL DATA:  Respiratory distress. COVID-19 EXAM: PORTABLE CHEST 1 VIEW COMPARISON:  None FINDINGS: Mild cardiomegaly. Diffuse bilateral airspace opacities, likely multifocal pneumonia. No pleural effusion or pneumothorax. IMPRESSION: Diffuse bilateral airspace opacities, likely multifocal pneumonia. Electronically Signed   By: Ulyses Jarred M.D.   On: 09/05/2019 01:03     Assessment/Plan Active Problems:   Acute respiratory failure with hypoxia (HCC)   Pneumonia due to COVID-19 virus  --Continue high flow oxygen and titrate to keep sats over 90% --IV remdesivir, IV steroids, vitamins per protocol --Proning as tolerated -monitor inflammatory biomarkers, for consideration of full dose Lovenox or initiation of Tocilizumab --    DVT prophylaxis: lovenox  Code Status: full   Athena Masse MD [facility]   If 7PM-7AM, please contact night-coverage [email or web address] Password [facility]  09/05/2019, 2:32 AM

## 2019-09-05 NOTE — ED Notes (Signed)
Pt is on 15lpm high flow [place] oxygen.

## 2019-09-05 NOTE — ED Notes (Signed)
Pt requesting pain medication for his back.  

## 2019-09-05 NOTE — ED Notes (Signed)
Pt given a dinner tray. Pt looks improved. Work of breathing has improved and sats are in the mid 90s since pt moved to 12L HFNC. Will continue to monitor pt sats.

## 2019-09-05 NOTE — ED Notes (Signed)
Pt placed on high flow nasal cannula oxygen by RT. Pt tolerating well.

## 2019-09-05 NOTE — ED Notes (Signed)
Pt given coke 

## 2019-09-05 NOTE — Progress Notes (Addendum)
PROGRESS NOTE    DRAY DENTE  GUR:[id number] DOB: Feb 04, 1966 DOA: 09/05/2019 PCP: Patient, No Pcp Per    Assessment & Plan:   Active Problems:   Acute respiratory failure with hypoxia (HCC)   Pneumonia due to COVID-19 virus    Ian Pennington is a 53 y.o. Caucasian male with no significant past medical history who tested positive for Covid a week prior who presented to the emergency room EMS with 10-day history of shortness of breath has been progressively worsening.   Acute respiratory failure with hypoxia 2/2 Pneumonia due to COVID-19 virus --CXR showed "Diffuse bilateral airspace opacities". Has been on High flow with minimum 7L requirement sating 91-93%, but easily desat with minimum exertion (or [place] falling out) and slow to recover, requiring O2 to be turned up to 10-15L.  --procal neg PLAN: --continue Remdesivir and solumedrol, Day 1 --d/c abx since procal neg --Continue high flow oxygen and titrate to keep sats over 90% --discussed with pt the possibility of transferring to [facility].  Pt preferred not to be transferred.  Given that pt is very with it, and O2 sat returns to 90's with conscious breathing and increasing O2 flow, and vitals otherwise stable, will keep him here for now.   --Keep on continuous pulse ox   DVT prophylaxis: Lovenox SQ Code Status: Full code  Family Communication: none today Disposition Plan: Home, will be here at least 5 days   Subjective and Interval History:  Pt reported feeling better since he presented.  Has been on High flow with minimum 7L requirement sating 91-93%, but easily desat with minimum exertion (or [place] falling out) and slow to recover, requiring O2 to be turned up to 10-15L.  Felt chest tightness.  No fever, abdominal pain, N/V/D, dysuria, increased swelling.     Objective: Vitals:   09/05/19 1530 09/05/19 1600 09/05/19 1630 09/05/19 1700  BP: (!) 162/73 (!) 170/78 (!) 169/85 (!) 175/80  Pulse: 86 100 98 97  Resp: 11  19 14 16   Temp:      TempSrc:      SpO2: 91% 94% 93% 92%  Weight:      Height:        Intake/Output Summary (Last 24 hours) at 09/05/2019 1824 Last data filed at 09/05/2019 0514 Gross per 24 hour  Intake 600 ml  Output --  Net 600 ml   Filed Weights   09/05/19 0024  Weight: 124.7 kg    Examination:   Constitutional: NAD, AAOx3 HEENT: conjunctivae and lids normal, EOMI CV: RRR no M,R,G. Distal pulses +2.  No cyanosis.   RESP: no obvious crackles or wheezes, on 7-15L high flow  GI: +BS, NTND Extremities: No effusions, edema, or tenderness in BLE SKIN: warm, dry and intact Neuro: II - XII grossly intact.  Sensation intact Psych: Normal mood and affect.  Appropriate judgement and reason   Data Reviewed: I have personally reviewed following labs and imaging studies  CBC: Recent Labs  Lab 09/05/19 0033  WBC 9.3  NEUTROABS 7.7  HGB 16.0  HCT 44.0  MCV 83.3  PLT 283   Basic Metabolic Panel: Recent Labs  Lab 09/05/19 0033  NA 137  K 3.4*  CL 100  CO2 24  GLUCOSE 140*  BUN 18  CREATININE 0.78  CALCIUM 8.7*   GFR: Estimated Creatinine Clearance: 150.5 mL/min (by C-G formula based on SCr of 0.78 mg/dL). Liver Function Tests: Recent Labs  Lab 09/05/19 0033  AST 41  ALT 31  ALKPHOS 55  BILITOT 1.3*  PROT 7.5  ALBUMIN 3.1*   No results for input(s): LIPASE, AMYLASE in the last 168 hours. No results for input(s): AMMONIA in the last 168 hours. Coagulation Profile: No results for input(s): INR, PROTIME in the last 168 hours. Cardiac Enzymes: No results for input(s): CKTOTAL, CKMB, CKMBINDEX, TROPONINI in the last 168 hours. BNP (last 3 results) No results for input(s): PROBNP in the last 8760 hours. HbA1C: No results for input(s): HGBA1C in the last 72 hours. CBG: No results for input(s): GLUCAP in the last 168 hours. Lipid Profile: Recent Labs    09/05/19 0033  TRIG 127   Thyroid Function Tests: No results for input(s): TSH, T4TOTAL, FREET4,  T3FREE, THYROIDAB in the last 72 hours. Anemia Panel: Recent Labs    09/05/19 0033  FERRITIN 838*   Sepsis Labs: Recent Labs  Lab 09/05/19 0033  PROCALCITON 0.12  LATICACIDVEN 1.8    Recent Results (from the past 240 hour(s))  Blood Culture (routine x 2)     Status: None (Preliminary result)   Collection Time: 09/05/19 12:34 AM   Specimen: BLOOD  Result Value Ref Range Status   Specimen Description BLOOD LEFT ANTECUBITAL  Final   Special Requests   Final    BOTTLES DRAWN AEROBIC AND ANAEROBIC Blood Culture results may not be optimal due to an excessive volume of blood received in culture bottles   Culture   Final    NO GROWTH < 12 HOURS Performed at [facility], [address] [place], [place] [address]    Report Status PENDING  Incomplete  Blood Culture (routine x 2)     Status: None (Preliminary result)   Collection Time: 09/05/19 12:34 AM   Specimen: BLOOD  Result Value Ref Range Status   Specimen Description BLOOD LEFT ARM  Final   Special Requests   Final    BOTTLES DRAWN AEROBIC AND ANAEROBIC Blood Culture adequate volume   Culture   Final    NO GROWTH < 12 HOURS Performed at [facility], [address] [place], [place] [address]    Report Status PENDING  Incomplete      Radiology Studies: DG Chest Port 1 View  Result Date: 09/05/2019 CLINICAL DATA:  Respiratory distress. COVID-19 EXAM: PORTABLE CHEST 1 VIEW COMPARISON:  None FINDINGS: Mild cardiomegaly. Diffuse bilateral airspace opacities, likely multifocal pneumonia. No pleural effusion or pneumothorax. IMPRESSION: Diffuse bilateral airspace opacities, likely multifocal pneumonia. Electronically Signed   By: Deatra RobinsonKevin  Herman M.D.   On: 09/05/2019 01:03     Scheduled Meds: . albuterol  2 puff Inhalation Q6H  . vitamin C  500 mg Oral Daily  . enoxaparin (LOVENOX) injection  40 mg Subcutaneous Q24H  . methylPREDNISolone (SOLU-MEDROL) injection  0.5 mg/kg Intravenous Q12H  .  multivitamin with minerals  1 tablet Oral Daily  . zinc sulfate  220 mg Oral Daily   Continuous Infusions: . azithromycin Stopped (09/05/19 0514)  . cefTRIAXone (ROCEPHIN)  IV Stopped (09/05/19 0245)  . [START ON 09/06/2019] remdesivir 100 mg in NS 100 mL       LOS: 0 days   Due to the severity of pt's hypoxia and high level of O2 requirement, I have had to see this pt multiple times today, and had multiple discussions with his nurse and charge nurse about his care.  I have spend 45 minutes on this patient today and have billed for inpatient extended care 1st hour.   Darlin Priestlyina Shalunda Lindh, MD [facility]  If 7PM-7AM, please contact night-coverage 09/05/2019, 6:24 PM

## 2019-09-05 NOTE — ED Notes (Signed)
Secretary to order hospital bed for pt. Pt would like more comfortable bed.

## 2019-09-05 NOTE — Progress Notes (Signed)
Remdesivir - Pharmacy Brief Note   O:  ALT: 31 CXR: evidence of infection SpO2: 95% on [place]   A/P:  Remdesivir 200 mg IVPB once followed by 100 mg IVPB daily x 4 days.   Hart Robinsons, PharmD 09/05/2019 2:01 AM

## 2019-09-05 NOTE — ED Provider Notes (Signed)
[facility] Emergency Department Provider Note    First MD Initiated Contact with Patient 09/05/19 0021     (approximate)  I have reviewed the triage vital signs and the nursing notes.   HISTORY  Chief Complaint covid    HPI Ian Pennington is a 53 y.o. male the below listed past medical history presents the ER for worsening shortness of breath cough congestion fevers and diarrheal illness.  His girlfriend tested positive for Covid 2 weeks ago.  He did not get tested but started feeling sick several days ago.  Denies any history of chronic illness.  Not any medications.  Does not smoke.  No history of lung or cardiac issues.   Patient found by EMS to be hypoxic on room air to the 60s.  Was placed on nonrebreather and transported to the hospital.   Past Medical History:  Diagnosis Date  . Allergy    Family History  Problem Relation Age of Onset  . Diabetes Mother   . Hyperlipidemia Father    Past Surgical History:  Procedure Laterality Date  . APPENDECTOMY  04/12/2015   Procedure: APPENDECTOMY;  Surgeon: Sherri Rad, MD;  Location: [facility] ORS;  Service: General;;  laparoscopic appendectomy converted to open appendectomy   Patient Active Problem List   Diagnosis Date Noted  . Acute respiratory failure with hypoxia ([place]) 09/05/2019  . Pneumonia due to COVID-19 virus 09/05/2019  . Abscess, periappendiceal 04/17/2015  . Acute appendicitis 04/12/2015      Prior to Admission medications   Medication Sig Start Date End Date Taking? Authorizing Provider  HYDROcodone-acetaminophen (NORCO) 5-325 MG per tablet Take 1 tablet by mouth every 6 (six) hours as needed for moderate pain. 05/02/15   Sherri Rad, MD    Allergies Patient has no known allergies.    Social History Social History   Tobacco Use  . Smoking status: Former Smoker    Quit date: 04/11/2009    Years since quitting: 10.4  . Smokeless tobacco: Never Used  Substance Use Topics  . Alcohol  use: No  . Drug use: No    Review of Systems Patient denies headaches, rhinorrhea, blurry vision, numbness, shortness of breath, chest pain, edema, cough, abdominal pain, nausea, vomiting, diarrhea, dysuria, fevers, rashes or hallucinations unless otherwise stated above in HPI. ____________________________________________   PHYSICAL EXAM:  VITAL SIGNS: Vitals:   09/05/19 0403 09/05/19 0405  BP:    Pulse: 92 91  Resp: (!) 24 (!) 28  Temp:    SpO2: 96% 97%    Constitutional: Alert and oriented. Ill appearing Eyes: Conjunctivae are normal.  Head: Atraumatic. Nose: No congestion/rhinnorhea. Mouth/Throat: Mucous membranes are moist.   Neck: No stridor. Painless ROM.  Cardiovascular: Normal rate, regular rhythm. Grossly normal heart sounds.  Good peripheral circulation. Respiratory: Normal respiratory effort.  No retractions. Lungs CTAB. Gastrointestinal: Soft and nontender. No distention. No abdominal bruits. No CVA tenderness. Genitourinary:  Musculoskeletal: No lower extremity tenderness nor edema.  No joint effusions. Neurologic:  Normal speech and language. No gross focal neurologic deficits are appreciated. No facial droop Skin:  Skin is warm, dry and intact. No rash noted. Psychiatric: Mood and affect are normal. Speech and behavior are normal.  ____________________________________________   LABS (all labs ordered are listed, but only abnormal results are displayed)  Results for orders placed or performed during the hospital encounter of 09/05/19 (from the past 24 hour(s))  Lactic acid, plasma     Status: None   Collection Time: 09/05/19  12:33 AM  Result Value Ref Range   Lactic Acid, Venous 1.8 0.5 - 1.9 mmol/L  CBC WITH DIFFERENTIAL     Status: Abnormal   Collection Time: 09/05/19 12:33 AM  Result Value Ref Range   WBC 9.3 4.0 - 10.5 K/uL   RBC 5.28 4.22 - 5.81 MIL/uL   Hemoglobin 16.0 13.0 - 17.0 g/dL   HCT 16.144.0 09.639.0 - 04.552.0 %   MCV 83.3 80.0 - 100.0 fL   MCH  30.3 26.0 - 34.0 pg   MCHC 36.4 (H) 30.0 - 36.0 g/dL   RDW 40.911.9 81.111.5 - 91.415.5 %   Platelets 323 150 - 400 K/uL   nRBC 0.0 0.0 - 0.2 %   Neutrophils Relative % 82 %   Neutro Abs 7.7 1.7 - 7.7 K/uL   Lymphocytes Relative 13 %   Lymphs Abs 1.2 0.7 - 4.0 K/uL   Monocytes Relative 4 %   Monocytes Absolute 0.3 0.1 - 1.0 K/uL   Eosinophils Relative 0 %   Eosinophils Absolute 0.0 0.0 - 0.5 K/uL   Basophils Relative 0 %   Basophils Absolute 0.0 0.0 - 0.1 K/uL   Immature Granulocytes 1 %   Abs Immature Granulocytes 0.09 (H) 0.00 - 0.07 K/uL   Abnormal Lymphocytes Present PRESENT    Polychromasia PRESENT   Comprehensive metabolic panel     Status: Abnormal   Collection Time: 09/05/19 12:33 AM  Result Value Ref Range   Sodium 137 135 - 145 mmol/L   Potassium 3.4 (L) 3.5 - 5.1 mmol/L   Chloride 100 98 - 111 mmol/L   CO2 24 22 - 32 mmol/L   Glucose, Bld 140 (H) 70 - 99 mg/dL   BUN 18 6 - 20 mg/dL   Creatinine, Ser 7.820.78 0.61 - 1.24 mg/dL   Calcium 8.7 (L) 8.9 - 10.3 mg/dL   Total Protein 7.5 6.5 - 8.1 g/dL   Albumin 3.1 (L) 3.5 - 5.0 g/dL   AST 41 15 - 41 U/L   ALT 31 0 - 44 U/L   Alkaline Phosphatase 55 38 - 126 U/L   Total Bilirubin 1.3 (H) 0.3 - 1.2 mg/dL   GFR calc non Af Amer >60 >60 mL/min   GFR calc Af Amer >60 >60 mL/min   Anion gap 13 5 - 15  Fibrin derivatives D-Dimer     Status: Abnormal   Collection Time: 09/05/19 12:33 AM  Result Value Ref Range   Fibrin derivatives D-dimer ([facility]) 1,010.04 (H) 0.00 - 499.00 ng/mL (FEU)  Procalcitonin     Status: None   Collection Time: 09/05/19 12:33 AM  Result Value Ref Range   Procalcitonin 0.12 ng/mL  Lactate dehydrogenase     Status: Abnormal   Collection Time: 09/05/19 12:33 AM  Result Value Ref Range   LDH 578 (H) 98 - 192 U/L  Ferritin     Status: Abnormal   Collection Time: 09/05/19 12:33 AM  Result Value Ref Range   Ferritin 838 (H) 24 - 336 ng/mL  Triglycerides     Status: None   Collection Time: 09/05/19 12:33 AM   Result Value Ref Range   Triglycerides 127 <150 mg/dL  Fibrinogen     Status: Abnormal   Collection Time: 09/05/19 12:33 AM  Result Value Ref Range   Fibrinogen >750 (H) 210 - 475 mg/dL  ABO/Rh     Status: None   Collection Time: 09/05/19 12:34 AM  Result Value Ref Range   ABO/RH(D)  A NEG Performed at [facility], [address] [place], [place] [address]   POC SARS Coronavirus 2 Ag     Status: Abnormal   Collection Time: 09/05/19 12:55 AM  Result Value Ref Range   SARS Coronavirus 2 Ag POSITIVE (A) NEGATIVE  Sample to Blood Bank     Status: None   Collection Time: 09/05/19  3:48 AM  Result Value Ref Range   Blood Bank Specimen SAMPLE AVAILABLE FOR TESTING    Sample Expiration      09/08/2019,2359 Performed at [facility], [address], [place], [place] [address]    ____________________________________________  EKG My review and personal interpretation at Time: 0:26   Indication: sob  Rate: 95  Rhythm: sinus Axis: normal Other: inferolateral t wave inversions ____________________________________________  RADIOLOGY  I personally reviewed all radiographic images ordered to evaluate for the above acute complaints and reviewed radiology reports and findings.  These findings were personally discussed with the patient.  Please see medical record for radiology report.  ____________________________________________   PROCEDURES  Procedure(s) performed:  .Critical Care Performed by: Willy Eddy, MD Authorized by: Willy Eddy, MD   Critical care provider statement:    Critical care time (minutes):  30   Critical care time was exclusive of:  Separately billable procedures and treating other patients   Critical care was necessary to treat or prevent imminent or life-threatening deterioration of the following conditions:  Respiratory failure   Critical care was time spent personally by me on the following activities:  Development of  treatment plan with patient or surrogate, discussions with consultants, evaluation of patient's response to treatment, examination of patient, obtaining history from patient or surrogate, ordering and performing treatments and interventions, ordering and review of laboratory studies, ordering and review of radiographic studies, pulse oximetry, re-evaluation of patient's condition and review of old charts      Critical Care performed: yes ____________________________________________   INITIAL IMPRESSION / ASSESSMENT AND PLAN / ED COURSE  Pertinent labs & imaging results that were available during my care of the patient were reviewed by me and considered in my medical decision making (see chart for details).   DDX: copd, chf, asthma, pna, ptx, pe, covid 19  Ian Pennington is a 53 y.o. who presents to the ED with shortness of breath and acute respiratory failure with hypoxia as described above.  Patient is ill-appearing but protecting his airway.  Presentation certainly concerning for Covid syndrome.  Blood will be sent for by differential.  Placed on high flow nasal cannula.  EKG does show some lateral T wave inversions that I suspect is secondary to the hypoxia and underlying inflammatory illness in the setting of Covid.  The patient will be placed on continuous pulse oximetry and telemetry for monitoring.  Laboratory evaluation will be sent to evaluate for the above complaints.      Clinical Course as of Sep 04 418  Sat Sep 05, 2019  0981 Presentation consistent with COVID-19 pneumonitis.  Covered with antibiotics.  Feels improved on high flow nasal cannula.  Will discuss with hospitalist for admission.   [PR]    Clinical Course User Index [PR] Willy Eddy, MD    The patient was evaluated in Emergency Department today for the symptoms described in the history of present illness. He/she was evaluated in the context of the global COVID-19 pandemic, which necessitated consideration  that the patient might be at risk for infection with the SARS-CoV-2 virus that causes COVID-19. Institutional  protocols and algorithms that pertain to the evaluation of patients at risk for COVID-19 are in a state of rapid change based on information released by regulatory bodies including the CDC and federal and state organizations. These policies and algorithms were followed during the patient's care in the ED.  As part of my medical decision making, I reviewed the following data within the electronic MEDICAL RECORD NUMBER Nursing notes reviewed and incorporated, Labs reviewed, notes from prior ED visits and  Controlled Substance Database   ____________________________________________   FINAL CLINICAL IMPRESSION(S) / ED DIAGNOSES  Final diagnoses:  Acute respiratory failure with hypoxia (HCC)  COVID-19 virus detected      NEW MEDICATIONS STARTED DURING THIS VISIT:  New Prescriptions   No medications on file     Note:  This document was prepared using Dragon voice recognition software and may include unintentional dictation errors.    Willy Eddy, MD 09/05/19 [phone]

## 2019-09-05 NOTE — ED Notes (Signed)
Patient given breakfast tray and TV remote

## 2019-09-05 NOTE — ED Notes (Signed)
RT called for hi-flow nasal cannula per MD

## 2019-09-06 LAB — CBC WITH DIFFERENTIAL/PLATELET
Abs Immature Granulocytes: 0.11 10*3/uL — ABNORMAL HIGH (ref 0.00–0.07)
Basophils Absolute: 0 10*3/uL (ref 0.0–0.1)
Basophils Relative: 0 %
Eosinophils Absolute: 0 10*3/uL (ref 0.0–0.5)
Eosinophils Relative: 0 %
HCT: 44.8 % (ref 39.0–52.0)
Hemoglobin: 15.7 g/dL (ref 13.0–17.0)
Immature Granulocytes: 1 %
Lymphocytes Relative: 6 %
Lymphs Abs: 0.7 10*3/uL (ref 0.7–4.0)
MCH: 31 pg (ref 26.0–34.0)
MCHC: 35 g/dL (ref 30.0–36.0)
MCV: 88.4 fL (ref 80.0–100.0)
Monocytes Absolute: 0.4 10*3/uL (ref 0.1–1.0)
Monocytes Relative: 3 %
Neutro Abs: 11.8 10*3/uL — ABNORMAL HIGH (ref 1.7–7.7)
Neutrophils Relative %: 90 %
Platelets: 383 10*3/uL (ref 150–400)
RBC: 5.07 MIL/uL (ref 4.22–5.81)
RDW: 11.9 % (ref 11.5–15.5)
WBC: 13.1 10*3/uL — ABNORMAL HIGH (ref 4.0–10.5)
nRBC: 0 % (ref 0.0–0.2)

## 2019-09-06 LAB — COMPREHENSIVE METABOLIC PANEL
ALT: 32 U/L (ref 0–44)
AST: 30 U/L (ref 15–41)
Albumin: 3.1 g/dL — ABNORMAL LOW (ref 3.5–5.0)
Alkaline Phosphatase: 56 U/L (ref 38–126)
Anion gap: 15 (ref 5–15)
BUN: 25 mg/dL — ABNORMAL HIGH (ref 6–20)
CO2: 24 mmol/L (ref 22–32)
Calcium: 8.9 mg/dL (ref 8.9–10.3)
Chloride: 101 mmol/L (ref 98–111)
Creatinine, Ser: 0.77 mg/dL (ref 0.61–1.24)
GFR calc Af Amer: >60 mL/min (ref >60)
GFR calc non Af Amer: >60 mL/min (ref >60)
Glucose, Bld: 254 mg/dL — ABNORMAL HIGH (ref 70–99)
Potassium: 3.2 mmol/L — ABNORMAL LOW (ref 3.5–5.1)
Sodium: 140 mmol/L (ref 135–145)
Total Bilirubin: 0.8 mg/dL (ref 0.3–1.2)
Total Protein: 7.2 g/dL (ref 6.5–8.1)

## 2019-09-06 LAB — FIBRIN DERIVATIVES D-DIMER (ARMC ONLY): Fibrin derivatives D-dimer (ARMC): 4868.46 ng/mL (FEU) — ABNORMAL HIGH (ref 0.00–499.00)

## 2019-09-06 LAB — MRSA PCR SCREENING: MRSA by PCR: NEGATIVE

## 2019-09-06 LAB — GLUCOSE, CAPILLARY: Glucose-Capillary: 260 mg/dL — ABNORMAL HIGH (ref 70–99)

## 2019-09-06 LAB — FERRITIN: Ferritin: 623 ng/mL — ABNORMAL HIGH (ref 24–336)

## 2019-09-06 LAB — C-REACTIVE PROTEIN: CRP: 4.4 mg/dL — ABNORMAL HIGH (ref <1.0)

## 2019-09-06 LAB — PHOSPHORUS: Phosphorus: 4.5 mg/dL (ref 2.5–4.6)

## 2019-09-06 LAB — MAGNESIUM: Magnesium: 2.5 mg/dL — ABNORMAL HIGH (ref 1.7–2.4)

## 2019-09-06 MED ORDER — HYDROCHLOROTHIAZIDE 25 MG PO TABS
25.0000 mg | ORAL_TABLET | Freq: Every day | ORAL | Status: DC
  Administered 2019-09-06: 25 mg via ORAL
  Filled 2019-09-06: qty 1

## 2019-09-06 MED ORDER — CHLORHEXIDINE GLUCONATE CLOTH 2 % EX PADS
6.0000 | MEDICATED_PAD | Freq: Every day | CUTANEOUS | Status: DC
  Administered 2019-09-06 – 2019-09-09 (×4): 6 via TOPICAL

## 2019-09-06 MED ORDER — POTASSIUM CHLORIDE CRYS ER 20 MEQ PO TBCR
40.0000 meq | EXTENDED_RELEASE_TABLET | Freq: Once | ORAL | Status: AC
  Administered 2019-09-06: 40 meq via ORAL
  Filled 2019-09-06: qty 2

## 2019-09-06 NOTE — ED Notes (Signed)
Report from Anna, RN

## 2019-09-06 NOTE — ED Notes (Signed)
Patient moved to a regular hospital bed.

## 2019-09-06 NOTE — ED Notes (Signed)
Pt assisted to bedside commode. Pt provided deodorant, toothbrush/toothpaste for oral care.

## 2019-09-06 NOTE — ED Notes (Signed)
Report given to [place], Therapist, sports. Pt will be moved after 1930 per ICU/charge.

## 2019-09-06 NOTE — ED Notes (Signed)
O2 sats in the 80's due to pt sleeping- hi-flow [place] increased to 13 L with improvement. (see flowsheet)

## 2019-09-06 NOTE — ED Notes (Signed)
Requested RN transportation at this time.

## 2019-09-06 NOTE — ED Notes (Signed)
Pt provided meal tray

## 2019-09-06 NOTE — Progress Notes (Signed)
PROGRESS NOTE    Ian Pennington  BMW:[id number] DOB: May 22, 1966 DOA: 09/05/2019 PCP: Patient, No Pcp Per    Assessment & Plan:   Active Problems:   Acute respiratory failure with hypoxia (HCC)   Pneumonia due to COVID-19 virus    Ian Pennington is a 53 y.o. Caucasian male with no significant past medical history who tested positive for Covid a week prior who presented to the emergency room EMS with 10-day history of shortness of breath has been progressively worsening.   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 Has been on High flow with minimum 7L requirement sating 91-93%, but easily desat with minimum exertion (or [place] falling out or sleeping) and slow to recover, requiring O2 to be turned up to 10-15L.  Pt likely has shallow breathing at baseline. --procal neg, abx d/c'ed. PLAN: --continue Remdesivir and solumedrol, Day 2 --vit and zinc --albuterol inhaler scheduled --Continue high flow oxygen and titrate to keep sats over 90% --discussed with pt the possibility of transferring to [facility].  Pt preferred not to be transferred, and rather wait in the ED for a room in step-down --Keep on continuous pulse ox --incentive spirometry  # Elevated BP --No hx of HTN and not on BP meds at home PLAN: --start HCTZ 25 mg daily for now  # Hypokalemia --replete PRN   DVT prophylaxis: Lovenox SQ Code Status: Full code  Family Communication: none today Disposition Plan: Home, will be here at least 5 days   Subjective and Interval History:  Pt reported waking up from sleep from coughing or desat.  No fever, chest pain, abdominal pain, N/V/D, dysuria, increased swelling.  Normal PO intake.    Still in the ED today, waiting to go to step-down unit.  Pt refused to go to [facility], and would rather wait in the ED for his room.   Objective:  Vitals:   09/06/19 1430 09/06/19 1500 09/06/19 1530 09/06/19 1600  BP:  (!) 168/83  (!) 168/96  Pulse: 82 67 75 81  Resp: 19 11 (!) 22 16  Temp:      TempSrc:      SpO2: 97% 97% 96% 93%  Weight:      Height:        Intake/Output Summary (Last 24 hours) at 09/06/2019 1704 Last data filed at 09/06/2019 1529 Gross per 24 hour  Intake 100 ml  Output 1075 ml  Net -975 ml   Filed Weights   09/05/19 0024  Weight: 124.7 kg    Examination:   Constitutional: NAD, AAOx3 HEENT: conjunctivae and lids normal, EOMI CV: RRR no M,R,G. Distal pulses +2.  No cyanosis.   RESP: no obvious crackles or wheezes, on 7-13L high flow  GI: +BS, NTND, large abdomen Extremities: No effusions, edema, or tenderness in BLE SKIN: warm, dry and intact Neuro: II - XII grossly intact.  Sensation intact Psych: Normal mood and affect.  Appropriate judgement and reason   Data Reviewed: I have personally reviewed following labs and imaging studies  CBC: Recent Labs  Lab 09/05/19 0033 09/06/19 0451  WBC 9.3 13.1*  NEUTROABS 7.7 11.8*  HGB 16.0 15.7  HCT 44.0 44.8  MCV 83.3 88.4  PLT 323 383   Basic Metabolic Panel: Recent Labs  Lab 09/05/19 0033 09/06/19 0451  NA 137 140  K 3.4* 3.2*  CL 100 101  CO2 24 24  GLUCOSE 140* 254*  BUN 18 25*  CREATININE 0.78 0.77  CALCIUM 8.7* 8.9  MG  --  2.5*  PHOS  --  4.5   GFR: Estimated Creatinine Clearance: 150.5 mL/min (by C-G formula based on SCr of 0.77 mg/dL). Liver Function Tests: Recent Labs  Lab 09/05/19 0033 09/06/19 0451  AST 41 30  ALT 31 32  ALKPHOS 55 56  BILITOT 1.3* 0.8  PROT 7.5 7.2  ALBUMIN 3.1* 3.1*   No results for input(s): LIPASE, AMYLASE in the last 168 hours. No results for input(s): AMMONIA in the last 168 hours. Coagulation Profile: No results for input(s): INR, PROTIME in the last 168 hours. Cardiac Enzymes: No results for input(s): CKTOTAL, CKMB, CKMBINDEX, TROPONINI in the last 168 hours. BNP (last 3 results)  No results for input(s): PROBNP in the last 8760 hours. HbA1C: No results for input(s): HGBA1C in the last 72 hours. CBG: No results for input(s): GLUCAP in the last 168 hours. Lipid Profile: Recent Labs    09/05/19 0033  TRIG 127   Thyroid Function Tests: No results for input(s): TSH, T4TOTAL, FREET4, T3FREE, THYROIDAB in the last 72 hours. Anemia Panel: Recent Labs    09/05/19 0033 09/06/19 0451  FERRITIN 838* 623*   Sepsis Labs: Recent Labs  Lab 09/05/19 0033  PROCALCITON 0.12  LATICACIDVEN 1.8    Recent Results (from the past 240 hour(s))  Blood Culture (routine x 2)     Status: None (Preliminary result)   Collection Time: 09/05/19 12:34 AM   Specimen: BLOOD  Result Value Ref Range Status   Specimen Description BLOOD LEFT ANTECUBITAL  Final   Special Requests   Final    BOTTLES DRAWN AEROBIC AND ANAEROBIC Blood Culture results may not be optimal due to an excessive volume of blood received in culture bottles   Culture   Final    NO GROWTH 1 DAY Performed at [facility], [address], [place], [place] [address]    Report Status PENDING  Incomplete  Blood Culture (routine x 2)     Status: None (Preliminary result)   Collection Time: 09/05/19 12:34 AM   Specimen: BLOOD  Result Value Ref Range Status   Specimen Description BLOOD LEFT ARM  Final   Special Requests   Final    BOTTLES DRAWN AEROBIC AND ANAEROBIC Blood Culture adequate volume   Culture   Final    NO GROWTH 1 DAY Performed at [facility], [address] [place], [place] [address]    Report Status PENDING  Incomplete      Radiology Studies: DG Chest Port 1 View  Result Date: 09/05/2019 CLINICAL DATA:  Respiratory distress. COVID-19 EXAM: PORTABLE CHEST 1 VIEW COMPARISON:  None FINDINGS: Mild cardiomegaly. Diffuse bilateral airspace opacities, likely multifocal pneumonia. No pleural effusion or pneumothorax. IMPRESSION: Diffuse bilateral airspace opacities, likely  multifocal pneumonia. Electronically Signed   By: Ulyses Jarred M.D.   On: 09/05/2019 01:03     Scheduled Meds: . albuterol  2 puff Inhalation Q6H  . vitamin C  500 mg Oral Daily  . enoxaparin (LOVENOX) injection  40 mg Subcutaneous Q24H  . hydrochlorothiazide  25 mg Oral Daily  . methylPREDNISolone (SOLU-MEDROL) injection  0.5 mg/kg Intravenous Q12H  . multivitamin with minerals  1 tablet Oral Daily  . zinc sulfate  220 mg Oral Daily   Continuous Infusions: . remdesivir 100 mg in NS 100 mL Stopped (09/06/19 1046)     LOS: 1 day   Due to  the severity of pt's hypoxia and high level of O2 requirement, I have had to see this pt multiple times today, and had multiple discussions with his nurse and charge nurse about his care.  I have spend 45 minutes on this patient today and have billed for inpatient extended care 1st hour.   Darlin Priestlyina Susy Placzek, MD [facility] If 7PM-7AM, please contact night-coverage 09/06/2019, 5:04 PM

## 2019-09-06 NOTE — ED Notes (Signed)
Answered pts call bell. Pts urinal emptied and output of 275 mL recorded. Pt requesting blankets to be rearranged. Blankets rearranged and hospital bed lengthened for comfort.

## 2019-09-06 NOTE — ED Notes (Signed)
Pt did not receive meal tray ordered from room service- ED meal tray provided.

## 2019-09-06 NOTE — ED Notes (Signed)
Attempted to call report- Dylan, RN to call back.

## 2019-09-07 ENCOUNTER — Encounter: Payer: Self-pay | Admitting: Hospitalist

## 2019-09-07 ENCOUNTER — Other Ambulatory Visit: Payer: Self-pay

## 2019-09-07 LAB — COMPREHENSIVE METABOLIC PANEL WITH GFR
ALT: 33 U/L (ref 0–44)
AST: 27 U/L (ref 15–41)
Albumin: 3 g/dL — ABNORMAL LOW (ref 3.5–5.0)
Alkaline Phosphatase: 49 U/L (ref 38–126)
Anion gap: 13 (ref 5–15)
BUN: 30 mg/dL — ABNORMAL HIGH (ref 6–20)
CO2: 23 mmol/L (ref 22–32)
Calcium: 8.8 mg/dL — ABNORMAL LOW (ref 8.9–10.3)
Chloride: 104 mmol/L (ref 98–111)
Creatinine, Ser: 0.75 mg/dL (ref 0.61–1.24)
GFR calc Af Amer: >60 mL/min
GFR calc non Af Amer: >60 mL/min
Glucose, Bld: 233 mg/dL — ABNORMAL HIGH (ref 70–99)
Potassium: 3.9 mmol/L (ref 3.5–5.1)
Sodium: 140 mmol/L (ref 135–145)
Total Bilirubin: 0.6 mg/dL (ref 0.3–1.2)
Total Protein: 6.8 g/dL (ref 6.5–8.1)

## 2019-09-07 LAB — CBC WITH DIFFERENTIAL/PLATELET
Abs Immature Granulocytes: 0.17 10*3/uL — ABNORMAL HIGH (ref 0.00–0.07)
Basophils Absolute: 0 10*3/uL (ref 0.0–0.1)
Basophils Relative: 0 %
Eosinophils Absolute: 0 10*3/uL (ref 0.0–0.5)
Eosinophils Relative: 0 %
HCT: 44.4 % (ref 39.0–52.0)
Hemoglobin: 15.4 g/dL (ref 13.0–17.0)
Immature Granulocytes: 1 %
Lymphocytes Relative: 4 %
Lymphs Abs: 0.8 10*3/uL (ref 0.7–4.0)
MCH: 30.8 pg (ref 26.0–34.0)
MCHC: 34.7 g/dL (ref 30.0–36.0)
MCV: 88.8 fL (ref 80.0–100.0)
Monocytes Absolute: 0.6 10*3/uL (ref 0.1–1.0)
Monocytes Relative: 4 %
Neutro Abs: 15.8 10*3/uL — ABNORMAL HIGH (ref 1.7–7.7)
Neutrophils Relative %: 91 %
Platelets: 400 10*3/uL (ref 150–400)
RBC: 5 MIL/uL (ref 4.22–5.81)
RDW: 12 % (ref 11.5–15.5)
WBC: 17.4 10*3/uL — ABNORMAL HIGH (ref 4.0–10.5)
nRBC: 0 % (ref 0.0–0.2)

## 2019-09-07 LAB — FIBRIN DERIVATIVES D-DIMER (ARMC ONLY): Fibrin derivatives D-dimer (ARMC): 4558.88 ng/mL (FEU) — ABNORMAL HIGH (ref 0.00–499.00)

## 2019-09-07 LAB — C-REACTIVE PROTEIN: CRP: 1.3 mg/dL — ABNORMAL HIGH (ref <1.0)

## 2019-09-07 LAB — HEMOGLOBIN A1C
Hgb A1c MFr Bld: 6.5 % — ABNORMAL HIGH (ref 4.8–5.6)
Mean Plasma Glucose: 139.85 mg/dL

## 2019-09-07 LAB — GLUCOSE, CAPILLARY
Glucose-Capillary: 217 mg/dL — ABNORMAL HIGH (ref 70–99)
Glucose-Capillary: 304 mg/dL — ABNORMAL HIGH (ref 70–99)
Glucose-Capillary: 236 mg/dL — ABNORMAL HIGH (ref 70–99)
Glucose-Capillary: 195 mg/dL — ABNORMAL HIGH (ref 70–99)

## 2019-09-07 LAB — MAGNESIUM: Magnesium: 2.4 mg/dL (ref 1.7–2.4)

## 2019-09-07 LAB — PHOSPHORUS: Phosphorus: 4.2 mg/dL (ref 2.5–4.6)

## 2019-09-07 LAB — FERRITIN: Ferritin: 593 ng/mL — ABNORMAL HIGH (ref 24–336)

## 2019-09-07 MED ORDER — HYDROCHLOROTHIAZIDE 50 MG PO TABS
50.0000 mg | ORAL_TABLET | Freq: Every day | ORAL | Status: DC
  Administered 2019-09-07 – 2019-09-12 (×6): 50 mg via ORAL
  Filled 2019-09-07: qty 1
  Filled 2019-09-07: qty 2
  Filled 2019-09-07 (×2): qty 1
  Filled 2019-09-07: qty 2
  Filled 2019-09-07 (×3): qty 1
  Filled 2019-09-07 (×3): qty 2

## 2019-09-07 MED ORDER — INSULIN ASPART 100 UNIT/ML ~~LOC~~ SOLN
0.0000 [IU] | Freq: Three times a day (TID) | SUBCUTANEOUS | Status: DC
  Administered 2019-09-07: 5 [IU] via SUBCUTANEOUS
  Administered 2019-09-07: 13:00:00 11 [IU] via SUBCUTANEOUS
  Administered 2019-09-07 – 2019-09-08 (×2): 5 [IU] via SUBCUTANEOUS
  Administered 2019-09-08: 10:00:00 3 [IU] via SUBCUTANEOUS
  Administered 2019-09-08: 17:00:00 5 [IU] via SUBCUTANEOUS
  Administered 2019-09-09: 8 [IU] via SUBCUTANEOUS
  Administered 2019-09-09: 3 [IU] via SUBCUTANEOUS
  Administered 2019-09-09: 11 [IU] via SUBCUTANEOUS
  Administered 2019-09-10: 5 [IU] via SUBCUTANEOUS
  Administered 2019-09-10: 3 [IU] via SUBCUTANEOUS
  Administered 2019-09-10: 11 [IU] via SUBCUTANEOUS
  Administered 2019-09-11: 5 [IU] via SUBCUTANEOUS
  Administered 2019-09-11: 3 [IU] via SUBCUTANEOUS
  Administered 2019-09-11 – 2019-09-12 (×2): 8 [IU] via SUBCUTANEOUS
  Administered 2019-09-12 (×2): 5 [IU] via SUBCUTANEOUS
  Filled 2019-09-07 (×15): qty 1

## 2019-09-07 MED ORDER — INSULIN GLARGINE 100 UNIT/ML ~~LOC~~ SOLN
5.0000 [IU] | Freq: Every day | SUBCUTANEOUS | Status: DC
  Administered 2019-09-07: 5 [IU] via SUBCUTANEOUS
  Filled 2019-09-07 (×2): qty 0.05

## 2019-09-07 NOTE — Progress Notes (Signed)
PROGRESS NOTE    Ian Pennington  PIR:[id number] DOB: 1966/04/22 DOA: 09/05/2019 PCP: Patient, No Pcp Per    Assessment & Plan:   Active Problems:   Acute respiratory failure with hypoxia (HCC)   Pneumonia due to COVID-19 virus    Ian Pennington is a 53 y.o. Caucasian male with no significant past medical history who tested positive for Covid a week prior who presented to the emergency room EMS with 10-day history of shortness of breath has been progressively worsening.   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 Has been on High flow with minimum 7L requirement sating 91-93%, but easily desat with minimum exertion (or [place] falling out or sleeping) and slow to recover, requiring O2 to be turned up to 10-15L.  Pt likely has shallow breathing at baseline. --procal neg, abx d/c'ed. PLAN: --continue Remdesivir and solumedrol, Day 3 --vit and zinc --albuterol inhaler scheduled --Continue high flow oxygen and titrate to keep sats over 90% --incentive spirometry  # Elevated BP --No hx of HTN and not on BP meds at home PLAN: --increase HCTZ to 50 mg daily   # Hypokalemia --replete PRN   DVT prophylaxis: Lovenox SQ Code Status: Full code  Family Communication: none today Disposition Plan: Home, will be here at least 5 days   Subjective and Interval History:  Pt continued to forget to breath deep, and desat down to 80's even on high flow.  No fever, chest pain, abdominal pain, N/V/D, dysuria, increased swelling.  Eating drinking normally.   Objective: Vitals:   09/07/19 1315 09/07/19 1400 09/07/19 1500 09/07/19 1525  BP:  139/67 136/79   Pulse: 91 81 65 79  Resp: 18 20 12  (!) 27  Temp:    97.7 F (36.5 C)  TempSrc:    Oral  SpO2: 93% 95% 96% 95%  Weight:      Height:        Intake/Output Summary (Last 24 hours) at 09/07/2019 1604 Last  data filed at 09/07/2019 1300 Gross per 24 hour  Intake 340 ml  Output 525 ml  Net -185 ml   Filed Weights   09/05/19 0024  Weight: 124.7 kg    Examination:   Constitutional: NAD, AAOx3 HEENT: conjunctivae and lids normal, EOMI CV: RRR no M,R,G. Distal pulses +2.  No cyanosis.   RESP: clear over anterior, on 7-13L high flow  GI: +BS, NTND, large abdomen Extremities: No effusions, edema, or tenderness in BLE SKIN: warm, dry and intact Neuro: II - XII grossly intact.  Sensation intact Psych: Normal mood and affect.  Appropriate judgement and reason   Data Reviewed: I have personally reviewed following labs and imaging studies  CBC: Recent Labs  Lab 09/05/19 0033 09/06/19 0451 09/07/19 0509  WBC 9.3 13.1* 17.4*  NEUTROABS 7.7 11.8* 15.8*  HGB 16.0 15.7 15.4  HCT 44.0 44.8 44.4  MCV 83.3 88.4 88.8  PLT 323 383 630   Basic Metabolic Panel: Recent Labs  Lab 09/05/19 0033 09/06/19 0451 09/07/19 0509  NA 137 140 140  K 3.4* 3.2* 3.9  CL 100 101 104  CO2 24 24 23   GLUCOSE 140* 254* 233*  BUN 18 25* 30*  CREATININE 0.78 0.77 0.75  CALCIUM 8.7* 8.9 8.8*  MG  --  2.5* 2.4  PHOS  --  4.5 4.2   GFR: Estimated Creatinine Clearance: 150.5 mL/min (by C-G formula based on  SCr of 0.75 mg/dL). Liver Function Tests: Recent Labs  Lab 09/05/19 0033 09/06/19 0451 09/07/19 0509  AST 41 30 27  ALT 31 32 33  ALKPHOS 55 56 49  BILITOT 1.3* 0.8 0.6  PROT 7.5 7.2 6.8  ALBUMIN 3.1* 3.1* 3.0*   No results for input(s): LIPASE, AMYLASE in the last 168 hours. No results for input(s): AMMONIA in the last 168 hours. Coagulation Profile: No results for input(s): INR, PROTIME in the last 168 hours. Cardiac Enzymes: No results for input(s): CKTOTAL, CKMB, CKMBINDEX, TROPONINI in the last 168 hours. BNP (last 3 results) No results for input(s): PROBNP in the last 8760 hours. HbA1C: Recent Labs    09/07/19 1033  HGBA1C 6.5*   CBG: Recent Labs  Lab 09/06/19 2059  09/07/19 0939 09/07/19 1146  GLUCAP 260* 217* 304*   Lipid Profile: Recent Labs    09/05/19 0033  TRIG 127   Thyroid Function Tests: No results for input(s): TSH, T4TOTAL, FREET4, T3FREE, THYROIDAB in the last 72 hours. Anemia Panel: Recent Labs    09/06/19 0451 09/07/19 0509  FERRITIN 623* 593*   Sepsis Labs: Recent Labs  Lab 09/05/19 0033  PROCALCITON 0.12  LATICACIDVEN 1.8    Recent Results (from the past 240 hour(s))  Blood Culture (routine x 2)     Status: None (Preliminary result)   Collection Time: 09/05/19 12:34 AM   Specimen: BLOOD  Result Value Ref Range Status   Specimen Description BLOOD LEFT ANTECUBITAL  Final   Special Requests   Final    BOTTLES DRAWN AEROBIC AND ANAEROBIC Blood Culture results may not be optimal due to an excessive volume of blood received in culture bottles   Culture   Final    NO GROWTH 2 DAYS Performed at [facility], [address], [place], [place] [address]    Report Status PENDING  Incomplete  Blood Culture (routine x 2)     Status: None (Preliminary result)   Collection Time: 09/05/19 12:34 AM   Specimen: BLOOD  Result Value Ref Range Status   Specimen Description BLOOD LEFT ARM  Final   Special Requests   Final    BOTTLES DRAWN AEROBIC AND ANAEROBIC Blood Culture adequate volume   Culture   Final    NO GROWTH 2 DAYS Performed at [facility], [address] [place], [place] [address]    Report Status PENDING  Incomplete  MRSA PCR Screening     Status: None   Collection Time: 09/06/19  9:21 PM   Specimen: Nasal Mucosa; Nasopharyngeal  Result Value Ref Range Status   MRSA by PCR NEGATIVE NEGATIVE Final    Comment:        The GeneXpert MRSA Assay (FDA approved for NASAL specimens only), is one component of a comprehensive MRSA colonization surveillance program. It is not intended to diagnose MRSA infection nor to guide or monitor treatment for MRSA infections. Performed at [facility], [address], [place], [place] [address]       Radiology Studies: No results found.   Scheduled Meds: . albuterol  2 puff Inhalation Q6H  . vitamin C  500 mg Oral Daily  . Chlorhexidine Gluconate Cloth  6 each Topical Daily  . enoxaparin (LOVENOX) injection  40 mg Subcutaneous Q24H  . hydrochlorothiazide  50 mg Oral Daily  . insulin aspart  0-15 Units Subcutaneous TID WC  . insulin glargine  5 Units Subcutaneous Daily  . methylPREDNISolone (SOLU-MEDROL) injection  0.5 mg/kg Intravenous  Q12H  . multivitamin with minerals  1 tablet Oral Daily  . zinc sulfate  220 mg Oral Daily   Continuous Infusions: . remdesivir 100 mg in NS 100 mL Stopped (09/07/19 0948)     LOS: 2 days   Due to the severity of pt's hypoxia and high level of O2 requirement, I have had to see this pt multiple times today, and had multiple discussions with his nurse and charge nurse about his care.  I have spend 45 minutes on this patient today and have billed for inpatient extended care 1st hour.   Darlin Priestlyina Akhilesh Sassone, MD [facility] If 7PM-7AM, please contact night-coverage 09/07/2019, 4:04 PM

## 2019-09-08 LAB — COMPREHENSIVE METABOLIC PANEL
ALT: 42 U/L (ref 0–44)
AST: 29 U/L (ref 15–41)
Albumin: 3 g/dL — ABNORMAL LOW (ref 3.5–5.0)
Alkaline Phosphatase: 54 U/L (ref 38–126)
Anion gap: 13 (ref 5–15)
BUN: 31 mg/dL — ABNORMAL HIGH (ref 6–20)
CO2: 23 mmol/L (ref 22–32)
Calcium: 8.9 mg/dL (ref 8.9–10.3)
Chloride: 100 mmol/L (ref 98–111)
Creatinine, Ser: 0.87 mg/dL (ref 0.61–1.24)
GFR calc Af Amer: >60 mL/min (ref >60)
GFR calc non Af Amer: >60 mL/min (ref >60)
Glucose, Bld: 223 mg/dL — ABNORMAL HIGH (ref 70–99)
Potassium: 4.5 mmol/L (ref 3.5–5.1)
Sodium: 136 mmol/L (ref 135–145)
Total Bilirubin: 0.9 mg/dL (ref 0.3–1.2)
Total Protein: 7.2 g/dL (ref 6.5–8.1)

## 2019-09-08 LAB — CBC WITH DIFFERENTIAL/PLATELET
Abs Immature Granulocytes: 0.21 10*3/uL — ABNORMAL HIGH (ref 0.00–0.07)
Basophils Absolute: 0 10*3/uL (ref 0.0–0.1)
Basophils Relative: 0 %
Eosinophils Absolute: 0 10*3/uL (ref 0.0–0.5)
Eosinophils Relative: 0 %
HCT: 47.3 % (ref 39.0–52.0)
Hemoglobin: 16.2 g/dL (ref 13.0–17.0)
Immature Granulocytes: 1 %
Lymphocytes Relative: 5 %
Lymphs Abs: 0.8 10*3/uL (ref 0.7–4.0)
MCH: 30.5 pg (ref 26.0–34.0)
MCHC: 34.2 g/dL (ref 30.0–36.0)
MCV: 88.9 fL (ref 80.0–100.0)
Monocytes Absolute: 0.6 10*3/uL (ref 0.1–1.0)
Monocytes Relative: 4 %
Neutro Abs: 14.2 10*3/uL — ABNORMAL HIGH (ref 1.7–7.7)
Neutrophils Relative %: 90 %
Platelets: 401 10*3/uL — ABNORMAL HIGH (ref 150–400)
RBC: 5.32 MIL/uL (ref 4.22–5.81)
RDW: 12 % (ref 11.5–15.5)
WBC: 15.8 10*3/uL — ABNORMAL HIGH (ref 4.0–10.5)
nRBC: 0 % (ref 0.0–0.2)

## 2019-09-08 LAB — MAGNESIUM: Magnesium: 2.6 mg/dL — ABNORMAL HIGH (ref 1.7–2.4)

## 2019-09-08 LAB — GLUCOSE, CAPILLARY
Glucose-Capillary: 198 mg/dL — ABNORMAL HIGH (ref 70–99)
Glucose-Capillary: 222 mg/dL — ABNORMAL HIGH (ref 70–99)
Glucose-Capillary: 217 mg/dL — ABNORMAL HIGH (ref 70–99)
Glucose-Capillary: 277 mg/dL — ABNORMAL HIGH (ref 70–99)

## 2019-09-08 LAB — FIBRIN DERIVATIVES D-DIMER (ARMC ONLY): Fibrin derivatives D-dimer (ARMC): 5274.18 ng/mL (FEU) — ABNORMAL HIGH (ref 0.00–499.00)

## 2019-09-08 LAB — PHOSPHORUS: Phosphorus: 4.6 mg/dL (ref 2.5–4.6)

## 2019-09-08 LAB — C-REACTIVE PROTEIN: CRP: 0.9 mg/dL (ref <1.0)

## 2019-09-08 LAB — FERRITIN: Ferritin: 584 ng/mL — ABNORMAL HIGH (ref 24–336)

## 2019-09-08 MED ORDER — INSULIN GLARGINE 100 UNIT/ML ~~LOC~~ SOLN
10.0000 [IU] | Freq: Every day | SUBCUTANEOUS | Status: DC
  Administered 2019-09-08 – 2019-09-09 (×2): 10 [IU] via SUBCUTANEOUS
  Filled 2019-09-08 (×2): qty 0.1

## 2019-09-08 MED ORDER — ENOXAPARIN SODIUM 60 MG/0.6ML ~~LOC~~ SOLN
0.5000 mg/kg | Freq: Two times a day (BID) | SUBCUTANEOUS | Status: DC
  Administered 2019-09-08 – 2019-09-12 (×8): 60 mg via SUBCUTANEOUS
  Filled 2019-09-08 (×10): qty 0.6

## 2019-09-08 NOTE — Plan of Care (Signed)
Pt up to BSC PRN, 10L HFNC to wall, no pain, expresses interest in going home as soon as he can

## 2019-09-08 NOTE — Progress Notes (Signed)
PROGRESS NOTE    Ian Pennington  HKV:[id number] DOB: [birth year] DOA: 09/05/2019 PCP: Patient, No Pcp Per    Assessment & Plan:   Active Problems:   Acute respiratory failure with hypoxia (HCC)   Pneumonia due to COVID-19 virus    Ian Pennington is a 53 y.o. Caucasian male with no significant past medical history who tested positive for Covid a week prior who presented to the emergency room EMS with 10-day history of shortness of breath has been progressively worsening.   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 Has been on High flow with minimum 7L requirement sating 91-93%, but easily desat with minimum exertion (or [place] falling out or sleeping) and slow to recover, requiring O2 to be turned up to 10-15L.  Pt likely has shallow breathing at baseline.  Today at 10L sating ~91%, stable. --procal neg, abx d/c'ed. PLAN: --continue Remdesivir and solumedrol, Day 4 --vit and zinc --albuterol inhaler scheduled --Continue high flow oxygen and titrate to keep sats over 90% --incentive spirometry  # Elevated BP,  --No hx of HTN and not on BP meds at home PLAN: --continue HCTZ to 50 mg daily   # Hypokalemia --replete PRN  # Hyperglycemia # Likely undiagnosed DM2 --A1c 6.5.  BG elevated also due to steroids. PLAN: --Start Lantus 10u daily --SSI TID   DVT prophylaxis: Lovenox SQ Code Status: Full code  Family Communication: none today Disposition Plan: Home   Subjective and Interval History:  Pt had no complaint today, except wished he could go home soon.  Still on 10L.  No fever, chest pain, abdominal pain, N/V/D, dysuria, increased swelling.  Normal PO intake.  Able to take care of his personal grooming and toileting.   Objective: Vitals:   09/08/19 1400 09/08/19 1500 09/08/19 1600 09/08/19 1700  BP: (!) 138/98 (!) 141/92 (!) 146/83 (!)  137/92  Pulse: 87 86 79 (!) 106  Resp: (!) 31 (!) 21 (!) 33 19  Temp:   97.6 F (36.4 C)   TempSrc:   Axillary   SpO2: 93% 95% 95% 93%  Weight:      Height:        Intake/Output Summary (Last 24 hours) at 09/08/2019 1825 Last data filed at 09/08/2019 1813 Gross per 24 hour  Intake 924 ml  Output 775 ml  Net 149 ml   Filed Weights   09/05/19 0024  Weight: 124.7 kg    Examination:   Constitutional: NAD, AAOx3 HEENT: conjunctivae and lids normal, EOMI CV: RRR no M,R,G. Distal pulses +2.  No cyanosis.   RESP: clear in all fields, on 10L high flow  GI: +BS, NTND, large abdomen Extremities: No effusions, edema, or tenderness in BLE SKIN: warm, dry and intact Neuro: II - XII grossly intact.  Sensation intact Psych: Normal mood and affect.  Appropriate judgement and reason   Data Reviewed: I have personally reviewed following labs and imaging studies  CBC: Recent Labs  Lab 09/05/19 0033 09/06/19 0451 09/07/19 0509 09/08/19 0604  WBC 9.3 13.1* 17.4* 15.8*  NEUTROABS 7.7 11.8* 15.8* 14.2*  HGB 16.0 15.7 15.4 16.2  HCT 44.0 44.8 44.4 47.3  MCV 83.3 88.4 88.8 88.9  PLT 323 383 400 564*   Basic Metabolic Panel: Recent Labs  Lab 09/05/19 0033 09/06/19 0451 09/07/19 0509 09/08/19 0604  NA 137 140 140 136  K 3.4* 3.2* 3.9 4.5  CL  100 101 104 100  CO2 24 24 23 23   GLUCOSE 140* 254* 233* 223*  BUN 18 25* 30* 31*  CREATININE 0.78 0.77 0.75 0.87  CALCIUM 8.7* 8.9 8.8* 8.9  MG  --  2.5* 2.4 2.6*  PHOS  --  4.5 4.2 4.6   GFR: Estimated Creatinine Clearance: 138.4 mL/min (by C-G formula based on SCr of 0.87 mg/dL). Liver Function Tests: Recent Labs  Lab 09/05/19 0033 09/06/19 0451 09/07/19 0509 09/08/19 0604  AST 41 30 27 29   ALT 31 32 33 42  ALKPHOS 55 56 49 54  BILITOT 1.3* 0.8 0.6 0.9  PROT 7.5 7.2 6.8 7.2  ALBUMIN 3.1* 3.1* 3.0* 3.0*   No results for input(s): LIPASE, AMYLASE in the last 168 hours. No results for input(s): AMMONIA in the last 168  hours. Coagulation Profile: No results for input(s): INR, PROTIME in the last 168 hours. Cardiac Enzymes: No results for input(s): CKTOTAL, CKMB, CKMBINDEX, TROPONINI in the last 168 hours. BNP (last 3 results) No results for input(s): PROBNP in the last 8760 hours. HbA1C: Recent Labs    09/07/19 1033  HGBA1C 6.5*   CBG: Recent Labs  Lab 09/07/19 1648 09/07/19 2057 09/08/19 0820 09/08/19 1137 09/08/19 1517  GLUCAP 236* 195* 198* 222* 217*   Lipid Profile: No results for input(s): CHOL, HDL, LDLCALC, TRIG, CHOLHDL, LDLDIRECT in the last 72 hours. Thyroid Function Tests: No results for input(s): TSH, T4TOTAL, FREET4, T3FREE, THYROIDAB in the last 72 hours. Anemia Panel: Recent Labs    09/07/19 0509 09/08/19 0604  FERRITIN 593* 584*   Sepsis Labs: Recent Labs  Lab 09/05/19 0033  PROCALCITON 0.12  LATICACIDVEN 1.8    Recent Results (from the past 240 hour(s))  Blood Culture (routine x 2)     Status: None (Preliminary result)   Collection Time: 09/05/19 12:34 AM   Specimen: BLOOD  Result Value Ref Range Status   Specimen Description BLOOD LEFT ANTECUBITAL  Final   Special Requests   Final    BOTTLES DRAWN AEROBIC AND ANAEROBIC Blood Culture results may not be optimal due to an excessive volume of blood received in culture bottles   Culture   Final    NO GROWTH 3 DAYS Performed at [facility], [address], [place], [address] [place]    Report Status PENDING  Incomplete  Blood Culture (routine x 2)     Status: None (Preliminary result)   Collection Time: 09/05/19 12:34 AM   Specimen: BLOOD  Result Value Ref Range Status   Specimen Description BLOOD LEFT ARM  Final   Special Requests   Final    BOTTLES DRAWN AEROBIC AND ANAEROBIC Blood Culture adequate volume   Culture   Final    NO GROWTH 3 DAYS Performed at [facility], [address] [place], [address] [place]    Report Status PENDING  Incomplete  MRSA PCR Screening     Status:  None   Collection Time: 09/06/19  9:21 PM   Specimen: Nasal Mucosa; Nasopharyngeal  Result Value Ref Range Status   MRSA by PCR NEGATIVE NEGATIVE Final    Comment:        The GeneXpert MRSA Assay (FDA approved for NASAL specimens only), is one component of a comprehensive MRSA colonization surveillance program. It is not intended to diagnose MRSA infection nor to guide or monitor treatment for MRSA infections. Performed at [facility], [address], [place], [address] [place]       Radiology  Studies: No results found.   Scheduled Meds: . albuterol  2 puff Inhalation Q6H  . vitamin C  500 mg Oral Daily  . Chlorhexidine Gluconate Cloth  6 each Topical Daily  . enoxaparin (LOVENOX) injection  0.5 mg/kg Subcutaneous Q12H  . hydrochlorothiazide  50 mg Oral Daily  . insulin aspart  0-15 Units Subcutaneous TID WC  . insulin glargine  10 Units Subcutaneous Daily  . methylPREDNISolone (SOLU-MEDROL) injection  0.5 mg/kg Intravenous Q12H  . multivitamin with minerals  1 tablet Oral Daily  . zinc sulfate  220 mg Oral Daily   Continuous Infusions: . remdesivir 100 mg in NS 100 mL 100 mg (09/08/19 1000)     LOS: 3 days     Darlin Priestlyina Vannia Pola, MD [facility] If 7PM-7AM, please contact night-coverage 09/08/2019, 6:25 PM

## 2019-09-09 LAB — CBC WITH DIFFERENTIAL/PLATELET
Abs Immature Granulocytes: 0.26 10*3/uL — ABNORMAL HIGH (ref 0.00–0.07)
Basophils Absolute: 0 10*3/uL (ref 0.0–0.1)
Basophils Relative: 0 %
Eosinophils Absolute: 0 10*3/uL (ref 0.0–0.5)
Eosinophils Relative: 0 %
HCT: 44.7 % (ref 39.0–52.0)
Hemoglobin: 16.3 g/dL (ref 13.0–17.0)
Immature Granulocytes: 2 %
Lymphocytes Relative: 5 %
Lymphs Abs: 0.6 10*3/uL — ABNORMAL LOW (ref 0.7–4.0)
MCH: 30.7 pg (ref 26.0–34.0)
MCHC: 36.5 g/dL — ABNORMAL HIGH (ref 30.0–36.0)
MCV: 84.2 fL (ref 80.0–100.0)
Monocytes Absolute: 0.4 10*3/uL (ref 0.1–1.0)
Monocytes Relative: 3 %
Neutro Abs: 11.9 10*3/uL — ABNORMAL HIGH (ref 1.7–7.7)
Neutrophils Relative %: 90 %
Platelets: 381 10*3/uL (ref 150–400)
RBC: 5.31 MIL/uL (ref 4.22–5.81)
RDW: 11.9 % (ref 11.5–15.5)
WBC: 13.2 10*3/uL — ABNORMAL HIGH (ref 4.0–10.5)
nRBC: 0 % (ref 0.0–0.2)

## 2019-09-09 LAB — COMPREHENSIVE METABOLIC PANEL
ALT: 44 U/L (ref 0–44)
AST: 26 U/L (ref 15–41)
Albumin: 3.1 g/dL — ABNORMAL LOW (ref 3.5–5.0)
Alkaline Phosphatase: 54 U/L (ref 38–126)
Anion gap: 10 (ref 5–15)
BUN: 32 mg/dL — ABNORMAL HIGH (ref 6–20)
CO2: 24 mmol/L (ref 22–32)
Calcium: 8.7 mg/dL — ABNORMAL LOW (ref 8.9–10.3)
Chloride: 99 mmol/L (ref 98–111)
Creatinine, Ser: 0.84 mg/dL (ref 0.61–1.24)
GFR calc Af Amer: >60 mL/min (ref >60)
GFR calc non Af Amer: >60 mL/min (ref >60)
Glucose, Bld: 286 mg/dL — ABNORMAL HIGH (ref 70–99)
Potassium: 4.7 mmol/L (ref 3.5–5.1)
Sodium: 133 mmol/L — ABNORMAL LOW (ref 135–145)
Total Bilirubin: 1 mg/dL (ref 0.3–1.2)
Total Protein: 6.7 g/dL (ref 6.5–8.1)

## 2019-09-09 LAB — FERRITIN: Ferritin: 583 ng/mL — ABNORMAL HIGH (ref 24–336)

## 2019-09-09 LAB — PHOSPHORUS: Phosphorus: 4.5 mg/dL (ref 2.5–4.6)

## 2019-09-09 LAB — C-REACTIVE PROTEIN: CRP: 1 mg/dL — ABNORMAL HIGH (ref <1.0)

## 2019-09-09 LAB — GLUCOSE, CAPILLARY
Glucose-Capillary: 263 mg/dL — ABNORMAL HIGH (ref 70–99)
Glucose-Capillary: 170 mg/dL — ABNORMAL HIGH (ref 70–99)
Glucose-Capillary: 329 mg/dL — ABNORMAL HIGH (ref 70–99)
Glucose-Capillary: 234 mg/dL — ABNORMAL HIGH (ref 70–99)

## 2019-09-09 LAB — MAGNESIUM: Magnesium: 2.4 mg/dL (ref 1.7–2.4)

## 2019-09-09 MED ORDER — INSULIN ASPART 100 UNIT/ML ~~LOC~~ SOLN
2.0000 [IU] | Freq: Three times a day (TID) | SUBCUTANEOUS | Status: DC
  Administered 2019-09-09 – 2019-09-10 (×3): 2 [IU] via SUBCUTANEOUS
  Filled 2019-09-09: qty 1

## 2019-09-09 MED ORDER — INSULIN GLARGINE 100 UNIT/ML ~~LOC~~ SOLN
12.0000 [IU] | Freq: Every day | SUBCUTANEOUS | Status: DC
  Administered 2019-09-10: 12 [IU] via SUBCUTANEOUS
  Filled 2019-09-09: qty 0.12

## 2019-09-09 NOTE — Progress Notes (Signed)
Inpatient Diabetes Program Recommendations  AACE/ADA: New Consensus Statement on Inpatient Glycemic Control (2015)  Target Ranges:  Prepandial:   less than 140 mg/dL      Peak postprandial:   less than 180 mg/dL (1-2 hours)      Critically ill patients:  140 - 180 mg/dL   Lab Results  Component Value Date   GLUCAP 263 (H) 09/09/2019   HGBA1C 6.5 (H) 09/07/2019    Review of Glycemic Control Results for DONA, KLEMANN (MRN [id number]) as of 09/09/2019 11:10  Ref. Range 09/08/2019 11:37 09/08/2019 15:17 09/08/2019 22:28 09/09/2019 05:07 09/09/2019 07:53  Glucose-Capillary Latest Ref Range: 70 - 99 mg/dL 222 (H)  NOVOLOG 5units + LANTUS 10 217 (H)  NOVOLOG 5units   277 (H)  No HS coverage  263 (H)  NOVOLOG 8units     Diabetes history: NONE Outpatient Diabetes medications: NONE Current orders for Inpatient glycemic control: Lantus 10 units daily; Novolog 0-15 TID with meals; Solumedrol 62.5 mg every 12 hours  Inpatient Diabetes Program Recommendations:     Please consider- -Lantus 12 units daily (0.1/kg) -Novolog 2-3 units TID with meals if eats at least 50% of meal -Change to carb modified diet  Thank you, Geoffry Paradise, RN, BSN Diabetes Coordinator Inpatient Diabetes Program [phone] (team pager from 8a-5p)   -

## 2019-09-09 NOTE — Progress Notes (Signed)
PROGRESS NOTE    Ian Pennington  ZOX:[id number] DOB: 1966-02-04 DOA: 09/05/2019 PCP: Patient, No Pcp Per   Brief Narrative:   Ian Sellersennis W Harlowis a 53 y.o.Caucasian malewithno significant past medical history who tested positive for Covid a week prior who presented to the emergency room EMS with 10-day history ofshortness of breath has been progressively worsening.  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 Has been on High flow with minimum 7L requirement sating 91-93%, but easily desat with minimum exertion (or [place] falling out or sleeping) and slow to recover, requiring O2 to be turned up to 10-15L.  Pt likely has shallow breathing at baseline.  Today at 10L sating ~91%, stable. --procal neg, abx d/c'ed. PLAN: --continue Remdesivir and solumedrol, Day 5 --Last dose of remdesevir today, continue decadron 6mg  for total 10 days --vit and zinc --albuterol inhaler scheduled --Continue high flow oxygen and titrate to keep sats over 90% --incentive spirometry  # Elevated BP,  --No hx of HTN and not on BP meds at home PLAN: --continue HCTZ to 50 mg daily   # Hypokalemia --replete PRN  # Hyperglycemia # Likely undiagnosed DM2 --A1c 6.5.  BG elevated also due to steroids. PLAN: --Start Lantus 10u daily --SSI TID  DVT prophylaxis: Lovenox SQ Code Status: Full code  Family Communication: none today Disposition Plan: Home   Consultants:   none  Procedures: none Antimicrobials: remdesevir (12/25-12/30)  Subjective: Seen and examined No acute events Remains on 10L HFNC  Objective: Vitals:   09/09/19 1100 09/09/19 1200 09/09/19 1300 09/09/19 1636  BP: (!) 148/84 (!) 152/68 132/82   Pulse: (!) 140 85 73     Resp: (!) 31 19 (!) 30   Temp:  97.6 F (36.4 C)  98.1 F (36.7 C)  TempSrc:  Oral    SpO2: (!) 81% 90% 95%   Weight:      Height:        Intake/Output Summary (Last 24 hours) at 09/09/2019 1717 Last data filed at 09/09/2019 1604 Gross per 24 hour  Intake 33.24 ml  Output 1600 ml  Net -1566.76 ml   Filed Weights   09/05/19 0024  Weight: 124.7 kg    Examination:  General exam: Appears calm and comfortable  Respiratory system: Scattered crackles.  Normal respiratory effort.  Speaking in complete sentences Cardiovascular system: S1 & S2 heard, RRR. No JVD, murmurs, rubs, gallops or clicks. No pedal edema. Gastrointestinal system: Abdomen is nondistended, soft and nontender. No organomegaly or masses felt. Normal bowel sounds heard. Central nervous system: Alert and oriented. No focal neurological deficits. Extremities: Symmetric 5 x 5 power. Skin: No rashes, lesions or ulcers Psychiatry: Judgement and insight appear normal. Mood & affect appropriate.     Data Reviewed: I have personally reviewed following labs and imaging studies  CBC: Recent Labs  Lab 09/05/19 0033 09/06/19 0451 09/07/19 0509 09/08/19 0604 09/09/19 0507  WBC 9.3 13.1* 17.4* 15.8* 13.2*  NEUTROABS 7.7 11.8* 15.8* 14.2* 11.9*  HGB 16.0 15.7 15.4 16.2 16.3  HCT 44.0 44.8 44.4 47.3 44.7  MCV 83.3 88.4 88.8 88.9 84.2  PLT 323 383 400 401* 381   Basic Metabolic Panel: Recent Labs  Lab 09/05/19 0033 09/06/19 0451 09/07/19 0509 09/08/19 0604 09/09/19 0507  NA 137  140 140 136 133*  K 3.4* 3.2* 3.9 4.5 4.7  CL 100 101 104 100 99  CO2 24 24 23 23 24   GLUCOSE 140* 254* 233* 223* 286*  BUN 18 25* 30* 31* 32*  CREATININE 0.78 0.77 0.75 0.87 0.84  CALCIUM 8.7* 8.9 8.8* 8.9 8.7*  MG  --  2.5* 2.4 2.6* 2.4  PHOS  --  4.5 4.2 4.6 4.5   GFR: Estimated Creatinine Clearance: 143.4 mL/min (by C-G formula based on SCr of 0.84 mg/dL). Liver Function Tests: Recent Labs  Lab 09/05/19 0033  09/06/19 0451 09/07/19 0509 09/08/19 0604 09/09/19 0507  AST 41 30 27 29 26   ALT 31 32 33 42 44  ALKPHOS 55 56 49 54 54  BILITOT 1.3* 0.8 0.6 0.9 1.0  PROT 7.5 7.2 6.8 7.2 6.7  ALBUMIN 3.1* 3.1* 3.0* 3.0* 3.1*   No results for input(s): LIPASE, AMYLASE in the last 168 hours. No results for input(s): AMMONIA in the last 168 hours. Coagulation Profile: No results for input(s): INR, PROTIME in the last 168 hours. Cardiac Enzymes: No results for input(s): CKTOTAL, CKMB, CKMBINDEX, TROPONINI in the last 168 hours. BNP (last 3 results) No results for input(s): PROBNP in the last 8760 hours. HbA1C: Recent Labs    09/07/19 1033  HGBA1C 6.5*   CBG: Recent Labs  Lab 09/08/19 1517 09/08/19 2228 09/09/19 0753 09/09/19 1145 09/09/19 1602  GLUCAP 217* 277* 263* 170* 329*   Lipid Profile: No results for input(s): CHOL, HDL, LDLCALC, TRIG, CHOLHDL, LDLDIRECT in the last 72 hours. Thyroid Function Tests: No results for input(s): TSH, T4TOTAL, FREET4, T3FREE, THYROIDAB in the last 72 hours. Anemia Panel: Recent Labs    09/08/19 0604 09/09/19 0507  FERRITIN 584* 583*   Sepsis Labs: Recent Labs  Lab 09/05/19 0033  PROCALCITON 0.12  LATICACIDVEN 1.8    Recent Results (from the past 240 hour(s))  Blood Culture (routine x 2)     Status: None (Preliminary result)   Collection Time: 09/05/19 12:34 AM   Specimen: BLOOD  Result Value Ref Range Status   Specimen Description BLOOD LEFT ANTECUBITAL  Final   Special Requests   Final    BOTTLES DRAWN AEROBIC AND ANAEROBIC Blood Culture results may not be optimal due to an excessive volume of blood received in culture bottles   Culture   Final    NO GROWTH 3 DAYS Performed at [facility], [address] [place], [address] [place]    Report Status PENDING  Incomplete  Blood Culture (routine x 2)     Status: None (Preliminary result)   Collection Time: 09/05/19 12:34 AM   Specimen: BLOOD  Result Value Ref Range Status    Specimen Description BLOOD LEFT ARM  Final   Special Requests   Final    BOTTLES DRAWN AEROBIC AND ANAEROBIC Blood Culture adequate volume   Culture   Final    NO GROWTH 3 DAYS Performed at [facility], [address] [place], [address] [place]    Report Status PENDING  Incomplete  MRSA PCR Screening     Status: None   Collection Time: 09/06/19  9:21 PM   Specimen: Nasal Mucosa; Nasopharyngeal  Result Value Ref Range Status   MRSA by PCR NEGATIVE NEGATIVE Final    Comment:        The GeneXpert MRSA Assay (FDA approved for NASAL specimens only), is one component of a comprehensive MRSA colonization surveillance program. It is not intended to diagnose MRSA  infection nor to guide or monitor treatment for MRSA infections. Performed at [facility], [address] [place], [place] [address]          Radiology Studies: No results found.      Scheduled Meds: . albuterol  2 puff Inhalation Q6H  . vitamin C  500 mg Oral Daily  . Chlorhexidine Gluconate Cloth  6 each Topical Daily  . enoxaparin (LOVENOX) injection  0.5 mg/kg Subcutaneous Q12H  . hydrochlorothiazide  50 mg Oral Daily  . insulin aspart  0-15 Units Subcutaneous TID WC  . insulin aspart  2 Units Subcutaneous TID WC  . [START ON 09/10/2019] insulin glargine  12 Units Subcutaneous Daily  . methylPREDNISolone (SOLU-MEDROL) injection  0.5 mg/kg Intravenous Q12H  . multivitamin with minerals  1 tablet Oral Daily  . zinc sulfate  220 mg Oral Daily   Continuous Infusions:   LOS: 4 days    Time spent: 35 minutes    Sidney Ace, MD [facility] Pager [phone]  If 7PM-7AM, please contact night-coverage [email or web address] Password [email or web address] 09/09/2019, 5:17 PM

## 2019-09-10 ENCOUNTER — Inpatient Hospital Stay
Admission: EM | Admit: 2019-09-10 | Payer: Self-pay | Source: Other Acute Inpatient Hospital | Admitting: Internal Medicine

## 2019-09-10 LAB — CULTURE, BLOOD (ROUTINE X 2)
Culture: NO GROWTH
Culture: NO GROWTH
Special Requests: ADEQUATE

## 2019-09-10 LAB — COMPREHENSIVE METABOLIC PANEL
ALT: 43 U/L (ref 0–44)
AST: 22 U/L (ref 15–41)
Albumin: 3 g/dL — ABNORMAL LOW (ref 3.5–5.0)
Alkaline Phosphatase: 47 U/L (ref 38–126)
Anion gap: 10 (ref 5–15)
BUN: 35 mg/dL — ABNORMAL HIGH (ref 6–20)
CO2: 27 mmol/L (ref 22–32)
Calcium: 8.6 mg/dL — ABNORMAL LOW (ref 8.9–10.3)
Chloride: 96 mmol/L — ABNORMAL LOW (ref 98–111)
Creatinine, Ser: 0.78 mg/dL (ref 0.61–1.24)
GFR calc Af Amer: >60 mL/min (ref >60)
GFR calc non Af Amer: >60 mL/min (ref >60)
Glucose, Bld: 239 mg/dL — ABNORMAL HIGH (ref 70–99)
Potassium: 4.8 mmol/L (ref 3.5–5.1)
Sodium: 133 mmol/L — ABNORMAL LOW (ref 135–145)
Total Bilirubin: 1 mg/dL (ref 0.3–1.2)
Total Protein: 6.7 g/dL (ref 6.5–8.1)

## 2019-09-10 LAB — CBC WITH DIFFERENTIAL/PLATELET
Abs Immature Granulocytes: 0.3 10*3/uL — ABNORMAL HIGH (ref 0.00–0.07)
Basophils Absolute: 0 10*3/uL (ref 0.0–0.1)
Basophils Relative: 0 %
Eosinophils Absolute: 0 10*3/uL (ref 0.0–0.5)
Eosinophils Relative: 0 %
HCT: 48.8 % (ref 39.0–52.0)
Hemoglobin: 16.9 g/dL (ref 13.0–17.0)
Immature Granulocytes: 2 %
Lymphocytes Relative: 5 %
Lymphs Abs: 0.7 10*3/uL (ref 0.7–4.0)
MCH: 30.6 pg (ref 26.0–34.0)
MCHC: 34.6 g/dL (ref 30.0–36.0)
MCV: 88.4 fL (ref 80.0–100.0)
Monocytes Absolute: 0.6 10*3/uL (ref 0.1–1.0)
Monocytes Relative: 4 %
Neutro Abs: 11.8 10*3/uL — ABNORMAL HIGH (ref 1.7–7.7)
Neutrophils Relative %: 89 %
Platelets: 387 10*3/uL (ref 150–400)
RBC: 5.52 MIL/uL (ref 4.22–5.81)
RDW: 11.8 % (ref 11.5–15.5)
WBC: 13.4 10*3/uL — ABNORMAL HIGH (ref 4.0–10.5)
nRBC: 0 % (ref 0.0–0.2)

## 2019-09-10 LAB — GLUCOSE, CAPILLARY
Glucose-Capillary: 224 mg/dL — ABNORMAL HIGH (ref 70–99)
Glucose-Capillary: 222 mg/dL — ABNORMAL HIGH (ref 70–99)
Glucose-Capillary: 189 mg/dL — ABNORMAL HIGH (ref 70–99)
Glucose-Capillary: 231 mg/dL — ABNORMAL HIGH (ref 70–99)

## 2019-09-10 LAB — MAGNESIUM: Magnesium: 2.4 mg/dL (ref 1.7–2.4)

## 2019-09-10 LAB — C-REACTIVE PROTEIN: CRP: 1 mg/dL — ABNORMAL HIGH (ref <1.0)

## 2019-09-10 LAB — FERRITIN: Ferritin: 617 ng/mL — ABNORMAL HIGH (ref 24–336)

## 2019-09-10 LAB — FIBRIN DERIVATIVES D-DIMER (ARMC ONLY): Fibrin derivatives D-dimer (ARMC): 2773.37 ng/mL (FEU) — ABNORMAL HIGH (ref 0.00–499.00)

## 2019-09-10 LAB — PHOSPHORUS: Phosphorus: 4.8 mg/dL — ABNORMAL HIGH (ref 2.5–4.6)

## 2019-09-10 MED ORDER — ALUM & MAG HYDROXIDE-SIMETH 200-200-20 MG/5ML PO SUSP
30.0000 mL | ORAL | Status: DC | PRN
  Administered 2019-09-10: 30 mL via ORAL
  Filled 2019-09-10: qty 30

## 2019-09-10 MED ORDER — INSULIN GLARGINE 100 UNIT/ML ~~LOC~~ SOLN
25.0000 [IU] | Freq: Every day | SUBCUTANEOUS | Status: DC
  Administered 2019-09-11 – 2019-09-12 (×2): 25 [IU] via SUBCUTANEOUS
  Filled 2019-09-10 (×3): qty 0.25

## 2019-09-10 MED ORDER — FAMOTIDINE 20 MG PO TABS
20.0000 mg | ORAL_TABLET | Freq: Every day | ORAL | Status: DC
  Administered 2019-09-10 – 2019-09-12 (×3): 20 mg via ORAL
  Filled 2019-09-10 (×3): qty 1

## 2019-09-10 MED ORDER — INSULIN ASPART 100 UNIT/ML ~~LOC~~ SOLN
6.0000 [IU] | Freq: Three times a day (TID) | SUBCUTANEOUS | Status: DC
  Administered 2019-09-10 – 2019-09-12 (×8): 6 [IU] via SUBCUTANEOUS
  Filled 2019-09-10 (×7): qty 1

## 2019-09-10 NOTE — Progress Notes (Signed)
Inpatient Diabetes Program Recommendations  AACE/ADA: New Consensus Statement on Inpatient Glycemic Control (2015)  Target Ranges:  Prepandial:   less than 140 mg/dL      Peak postprandial:   less than 180 mg/dL (1-2 hours)      Critically ill patients:  140 - 180 mg/dL   Lab Results  Component Value Date   GLUCAP 224 (H) 09/10/2019   HGBA1C 6.5 (H) 09/07/2019    Review of Glycemic Control  Results for LOGEN, HEINTZELMAN (MRN [id number]) as of 09/10/2019 10:45  Ref. Range 09/09/2019 07:53 09/09/2019 11:45 09/09/2019 16:02 09/09/2019 22:06 09/10/2019 08:00  Glucose-Capillary Latest Ref Range: 70 - 99 mg/dL 263 (H)  Novolog 8units     170 (H)  Novolog 5units 329 (H)  Novolog 13units 234 (H)  Solumedrol 62.5 mg 224 (H)  Novolog 13units + lantus 12units + solumedrol 62.5 mg   Diabetes history: NONE Outpatient Diabetes medications: NONE Current orders for Inpatient glycemic control: Lantus 12 units daily; Novolog 0-15 TID with meals; Novolog 2 units TID with meals; Solumedrol 62.5 mg every 12 hours  Inpatient Diabetes Program Recommendations:     - Lantus 25 daily (0.2/kg) - Novolog 6 units TID with meals if eats at least 50% of meals  Thank you, Geoffry Paradise, RN, BSN Diabetes Coordinator Inpatient Diabetes Program [phone] (team pager from 8a-5p)

## 2019-09-10 NOTE — Progress Notes (Signed)
patient refused transfer to [facility] and transfer request cancelled

## 2019-09-10 NOTE — Progress Notes (Signed)
PROGRESS NOTE    Ian Pennington  ZOX:[id number] DOB: 05/31/1966 DOA: 09/05/2019 PCP: Patient, No Pcp Per   Brief Narrative:   Ian Sellersennis W Harlowis a 53 y.o.Caucasian malewithno significant past medical history who tested positive for Covid a week prior who presented to the emergency room EMS with 10-day history ofshortness of breath has been progressively worsening.  12/31: Patient is on 6 L nasal cannula.  Starting to feel better.  [facility]  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 Has been on High flow with minimum 7L requirement sating 91-93%, but easily desat with minimum exertion (or [place] falling out or sleeping) and slow to recover, requiring O2 to be turned up to 10-15L.  Pt likely has shallow breathing at baseline.  Today at 10L sating ~91%, stable. --procal neg, abx d/c'ed. PLAN: --Completed remdesevir 5 days - Continue Decadron (day 6 of 10) --vit and zinc --albuterol inhaler scheduled --Continue high flow oxygen and titrate to keep sats over 90% --incentive spirometry - transfer to med surg  # Elevated BP,  --No hx of HTN and not on BP meds at home PLAN: --continue HCTZ to 50 mg daily   # Hypokalemia --replete PRN  # Hyperglycemia # Likely undiagnosed DM2 --A1c 6.5.  BG elevated also due to steroids. PLAN: - Diabetic coordinator following - Lantus 25U daily - Novolog 6U TID WM  DVT prophylaxis: Lovenox SQ Code Status: Full code  Family Communication: none today Disposition Plan: Home   Consultants:   none  Procedures: none Antimicrobials: remdesevir (12/25-12/30)  Subjective: Seen and examined No acute events Titrated to 6L [place] Transfer to med  surg  Objective: Vitals:   09/10/19 0700 09/10/19 0800 09/10/19 0900 09/10/19 1000  BP: 113/73 (!) 127/97 122/73 126/80  Pulse: 60 65 82   Resp: (!) 23 18 (!) 25   Temp:    (!) 97.1 F (36.2 C)  TempSrc:      SpO2: 96% 94% 94%   Weight:      Height:        Intake/Output Summary (Last 24 hours) at 09/10/2019 1516 Last data filed at 09/10/2019 0300 Gross per 24 hour  Intake --  Output 1700 ml  Net -1700 ml   Filed Weights   09/05/19 0024  Weight: 124.7 kg    Examination:  General exam: Appears calm and comfortable  Respiratory system: Scattered crackles.  Normal respiratory effort.  Speaking in complete sentences Cardiovascular system: S1 & S2 heard, RRR. No JVD, murmurs, rubs, gallops or clicks. No pedal edema. Gastrointestinal system: Abdomen is nondistended, soft and nontender. No organomegaly or masses felt. Normal bowel sounds heard. Central nervous system: Alert and oriented. No focal neurological deficits. Extremities: Symmetric 5 x 5 power. Skin: No rashes, lesions or ulcers Psychiatry: Judgement and insight appear normal. Mood & affect appropriate.     Data Reviewed: I have personally reviewed following labs and imaging studies  CBC: Recent Labs  Lab 09/06/19 0451 09/07/19 0509 09/08/19 0604 09/09/19 0507 09/10/19 0454  WBC 13.1* 17.4* 15.8* 13.2* 13.4*  NEUTROABS 11.8* 15.8* 14.2* 11.9* 11.8*  HGB 15.7 15.4 16.2 16.3 16.9  HCT 44.8 44.4 47.3 44.7 48.8  MCV 88.4 88.8 88.9 84.2 88.4  PLT 383 400 401* 381  161   Basic Metabolic Panel: Recent Labs  Lab 09/06/19 0451 09/07/19 0509 09/08/19 0604 09/09/19 0507 09/10/19 0454  NA 140 140 136 133* 133*  K 3.2* 3.9 4.5 4.7 4.8  CL 101 104 100 99 96*  CO2 24 23 23 24 27   GLUCOSE 254* 233* 223* 286* 239*  BUN 25* 30* 31* 32* 35*  CREATININE 0.77 0.75 0.87 0.84 0.78  CALCIUM 8.9 8.8* 8.9 8.7* 8.6*  MG 2.5* 2.4 2.6* 2.4 2.4  PHOS 4.5 4.2 4.6 4.5 4.8*   GFR: Estimated Creatinine Clearance: 150.5  mL/min (by C-G formula based on SCr of 0.78 mg/dL). Liver Function Tests: Recent Labs  Lab 09/06/19 0451 09/07/19 0509 09/08/19 0604 09/09/19 0507 09/10/19 0454  AST 30 27 29 26 22   ALT 32 33 42 44 43  ALKPHOS 56 49 54 54 47  BILITOT 0.8 0.6 0.9 1.0 1.0  PROT 7.2 6.8 7.2 6.7 6.7  ALBUMIN 3.1* 3.0* 3.0* 3.1* 3.0*   No results for input(s): LIPASE, AMYLASE in the last 168 hours. No results for input(s): AMMONIA in the last 168 hours. Coagulation Profile: No results for input(s): INR, PROTIME in the last 168 hours. Cardiac Enzymes: No results for input(s): CKTOTAL, CKMB, CKMBINDEX, TROPONINI in the last 168 hours. BNP (last 3 results) No results for input(s): PROBNP in the last 8760 hours. HbA1C: No results for input(s): HGBA1C in the last 72 hours. CBG: Recent Labs  Lab 09/09/19 1145 09/09/19 1602 09/09/19 2206 09/10/19 0800 09/10/19 1155  GLUCAP 170* 329* 234* 224* 222*   Lipid Profile: No results for input(s): CHOL, HDL, LDLCALC, TRIG, CHOLHDL, LDLDIRECT in the last 72 hours. Thyroid Function Tests: No results for input(s): TSH, T4TOTAL, FREET4, T3FREE, THYROIDAB in the last 72 hours. Anemia Panel: Recent Labs    09/09/19 0507 09/10/19 0454  FERRITIN 583* 617*   Sepsis Labs: Recent Labs  Lab 09/05/19 0033  PROCALCITON 0.12  LATICACIDVEN 1.8    Recent Results (from the past 240 hour(s))  Blood Culture (routine x 2)     Status: None   Collection Time: 09/05/19 12:34 AM   Specimen: BLOOD  Result Value Ref Range Status   Specimen Description BLOOD LEFT ANTECUBITAL  Final   Special Requests   Final    BOTTLES DRAWN AEROBIC AND ANAEROBIC Blood Culture results may not be optimal due to an excessive volume of blood received in culture bottles   Culture   Final    NO GROWTH 5 DAYS Performed at [facility], [address] [place], [place] [address]    Report Status 09/10/2019 FINAL  Final  Blood Culture (routine x 2)     Status: None   Collection  Time: 09/05/19 12:34 AM   Specimen: BLOOD  Result Value Ref Range Status   Specimen Description BLOOD LEFT ARM  Final   Special Requests   Final    BOTTLES DRAWN AEROBIC AND ANAEROBIC Blood Culture adequate volume   Culture   Final    NO GROWTH 5 DAYS Performed at [facility], [address], [place], [place] [address]    Report Status 09/10/2019 FINAL  Final  MRSA PCR Screening     Status: None   Collection Time: 09/06/19  9:21 PM   Specimen: Nasal Mucosa; Nasopharyngeal  Result Value Ref Range Status   MRSA by PCR NEGATIVE NEGATIVE Final    Comment:        The GeneXpert MRSA Assay (FDA approved for NASAL specimens only), is one  component of a comprehensive MRSA colonization surveillance program. It is not intended to diagnose MRSA infection nor to guide or monitor treatment for MRSA infections. Performed at [facility], [address] [place], [place] [address]          Radiology Studies: No results found.      Scheduled Meds: . albuterol  2 puff Inhalation Q6H  . vitamin C  500 mg Oral Daily  . Chlorhexidine Gluconate Cloth  6 each Topical Daily  . enoxaparin (LOVENOX) injection  0.5 mg/kg Subcutaneous Q12H  . hydrochlorothiazide  50 mg Oral Daily  . insulin aspart  0-15 Units Subcutaneous TID WC  . insulin aspart  6 Units Subcutaneous TID WC  . [START ON 09/11/2019] insulin glargine  25 Units Subcutaneous Daily  . methylPREDNISolone (SOLU-MEDROL) injection  0.5 mg/kg Intravenous Q12H  . multivitamin with minerals  1 tablet Oral Daily  . zinc sulfate  220 mg Oral Daily   Continuous Infusions:   LOS: 5 days    Time spent: 35 minutes    Tresa Moore, MD [facility] Pager [phone]  If 7PM-7AM, please contact night-coverage [email or web address] Password [email or web address] 09/10/2019, 3:16 PM

## 2019-09-10 NOTE — Progress Notes (Signed)
Patient has refused to be transferred to [facility], threatening to leave AMA if he did go. Agricultural consultant notified. Charge RN called nursing supervisor to notify them. Weaned patient O2 from 10L to 8L.

## 2019-09-10 NOTE — Progress Notes (Signed)
Bed request for [facility] made per nursing staff as patient with decreasing oxygnerequirements and meets criteria for specialty services offered there.  Discussed with Dr. Tennis Must at specialty hospital and patietn was accepted for transfer

## 2019-09-11 DIAGNOSIS — J1282 Pneumonia due to coronavirus disease 2019: Secondary | ICD-10-CM

## 2019-09-11 LAB — GLUCOSE, CAPILLARY
Glucose-Capillary: 285 mg/dL — ABNORMAL HIGH (ref 70–99)
Glucose-Capillary: 208 mg/dL — ABNORMAL HIGH (ref 70–99)
Glucose-Capillary: 189 mg/dL — ABNORMAL HIGH (ref 70–99)
Glucose-Capillary: 226 mg/dL — ABNORMAL HIGH (ref 70–99)

## 2019-09-11 MED ORDER — INFLUENZA VAC SPLIT QUAD 0.5 ML IM SUSY
0.5000 mL | PREFILLED_SYRINGE | INTRAMUSCULAR | Status: AC
  Administered 2019-09-12: 0.5 mL via INTRAMUSCULAR
  Filled 2019-09-11 (×2): qty 0.5

## 2019-09-11 NOTE — Evaluation (Signed)
Physical Therapy Evaluation Patient Details Name: Ian Pennington MRN: [id number] DOB: 06/09/1966 Today's Date: 09/11/2019   History of Present Illness  presented to ER secondary to SOB, hypoxia; admitted for management of acute respiratory failure with hypoxia, multifocal PNA due to COVID-19 virus. Initially requiring HFNC, now weaned to standard [place] but with significant desat with exertion.  Has refused transfer to [facility]  Clinical Impression  Upon evaluation, patient alert and oriented; follows commands and participates well with session. Eager for return to activity as appropriate.  Bilat UE/LE strength and ROM Grossly symmetrical and WFL; no focal weakness appreciated.  Able to complete bed mobility indep; sit/stand, basic transfers and gait (40' x2) without assist device, mod indep.  Demonstrates guarded posturing, emphasis on pursed lip breathing and cardirespiratory response to activity; no overt buckling, LOB or significant safety concerns.  Desat to 80% on 3L, 84% on 5L requiring at least 2 min seated rest for recovery >90%.  Anticipate need for supplemental O2 upon discharge; will continue to assess cardiopulmonary response to activity and establish appropriate amount of O2 support needed with functional activities. Will benefit from extensive education and training on recognition of signs/symptoms of desaturation, fatigue, activity pacing and overall energy conservation during recovery period. Would benefit from skilled PT to address above deficits and promote optimal return to PLOF.;  Will maintain on PT caseload throughout remaining hospitalization.  Do not anticipate formal PT needs upon discharge, but do feel patient to benefit from oupatient pulmonary rehab program.    SaO2 on room air at rest = 84% SaO2 on 3 liters of O2 at rest = 90% SaO2 on 3 liters of O2 while ambulating = 80% SaO2 on 5 liters of O2 while ambulating = 84%   Follow Up Recommendations No PT follow up(oupatient  pulmonary rehab)    Equipment Recommendations       Recommendations for Other Services       Precautions / Restrictions Precautions Precautions: None Restrictions Weight Bearing Restrictions: No      Mobility  Bed Mobility Overal bed mobility: Independent                Transfers Overall transfer level: Independent                  Ambulation/Gait Ambulation/Gait assistance: Modified independent (Device/Increase time) Gait Distance (Feet): 40 Feet(x2) Assistive device: None       General Gait Details: guarded posturing, emphasis on pursed lip breathing and cardirespiratory response to activity.  Desat to 80% on 3L, 84% on 5L requiring at least 2 min seated rest for recovery >90%.  Stairs            Wheelchair Mobility    Modified Rankin (Stroke Patients Only)       Balance Overall balance assessment: Needs assistance Sitting-balance support: No upper extremity supported;Feet supported Sitting balance-Leahy Scale: Good     Standing balance support: No upper extremity supported Standing balance-Leahy Scale: Good                               Pertinent Vitals/Pain Pain Assessment: No/denies pain    Home Living Family/patient expects to be discharged to:: Private residence Living Arrangements: Spouse/significant other             Home Equipment: None      Prior Function Level of Independence: Independent         Comments: Indep with ADLS, household  and community mobilization without assist device; denies fall history; no home O2.  Employeed as Catering manager.     Hand Dominance        Extremity/Trunk Assessment   Upper Extremity Assessment Upper Extremity Assessment: Overall WFL for tasks assessed    Lower Extremity Assessment Lower Extremity Assessment: Overall WFL for tasks assessed       Communication   Communication: No difficulties  Cognition Arousal/Alertness: Awake/alert Behavior  During Therapy: WFL for tasks assessed/performed Overall Cognitive Status: Within Functional Limits for tasks assessed                                        General Comments      Exercises Other Exercises Other Exercises: Educated on signs/symptoms of fatigue and desaturation (SOB, elevated HR), importance of recognition and appropriate adjustment of activity pacing.  Discussed energy conservation and graduation re-introduction of activity. Other Exercises: Reinforced importance of upright positioning, change of position when in bed and participation with incentive spirometer/flutter valve; patient voiced understanding and agreement with all information.   Assessment/Plan    PT Assessment Patient needs continued PT services  PT Problem List Cardiopulmonary status limiting activity       PT Treatment Interventions Patient/family education;Gait training;Stair training;Therapeutic activities;Functional mobility training    PT Goals (Current goals can be found in the Care Plan section)  Acute Rehab PT Goals Patient Stated Goal: to get myself better so I can get back to work PT Goal Formulation: With patient Time For Goal Achievement: 09/25/19 Potential to Achieve Goals: Good Additional Goals Additional Goal #1: Indep understanding and demonstration of activity pacing, energy conservation with functional activities.    Frequency Min 2X/week   Barriers to discharge        Co-evaluation               AM-PAC PT "6 Clicks" Mobility  Outcome Measure Help needed turning from your back to your side while in a flat bed without using bedrails?: None Help needed moving from lying on your back to sitting on the side of a flat bed without using bedrails?: None Help needed moving to and from a bed to a chair (including a wheelchair)?: None Help needed standing up from a chair using your arms (e.g., wheelchair or bedside chair)?: None Help needed to walk in hospital  room?: None Help needed climbing 3-5 steps with a railing? : None 6 Click Score: 24    End of Session Equipment Utilized During Treatment: Oxygen Activity Tolerance: Patient tolerated treatment well Patient left: in bed;with call bell/phone within reach Nurse Communication: Mobility status PT Visit Diagnosis: Difficulty in walking, not elsewhere classified (R26.2)    Time: 6948-5462 PT Time Calculation (min) (ACUTE ONLY): 23 min   Charges:   PT Evaluation $PT Eval Moderate Complexity: 1 Mod PT Treatments $Therapeutic Activity: 8-22 mins       Shanta Dorvil H. Manson Passey, PT, DPT, NCS 09/11/19, 3:39 PM [phone]

## 2019-09-11 NOTE — Progress Notes (Signed)
PROGRESS NOTE    Ian Pennington  JKD:[id number] DOB: April 01, 1966 DOA: 09/05/2019 PCP: Patient, No Pcp Per   Brief Narrative:   Ian Pennington a 54 y.o.Caucasian malewithno significant past medical history who tested positive for Covid a week prior who presented to the emergency room EMS with 10-day history ofshortness of breath has been progressively worsening.  12/31: Patient is on 6 L nasal cannula.  Starting to feel better.  Refused transfer to [facility].  1/1: Patient weaned down to 3 L nasal cannula.  Clinically improving.  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 Has been on High flow with minimum 7L requirement sating 91-93%, but easily desat with minimum exertion (or [place] falling out or sleeping) and slow to recover, requiring O2 to be turned up to 10-15L.  Pt likely has shallow breathing at baseline.  Today at 10L sating ~91%, stable. --procal neg, abx d/c'ed. - 1/1: titrated to 3L, off the high flow [place] PLAN: --Completed remdesevir 5 days - Continue Decadron (day 7 of 10) --vit and zinc --albuterol inhaler scheduled --Continue oxygen and titrate to keep sats over 90% --incentive spirometry --Attempt to wean from supplemental oxygen.  If unsuccessful can consider home oxygen therapy - PT and OT consulted  # Elevated BP,  --No hx of HTN and not on BP meds at home PLAN: --continue HCTZ to 50 mg daily   # Hypokalemia --replete PRN  # Hyperglycemia # Likely undiagnosed DM2 --A1c 6.5.  BG elevated also due to steroids. PLAN: - Diabetic coordinator following - Lantus 25U daily - Novolog 6U TID WM  DVT prophylaxis: Lovenox SQ Code Status: Full code  Family Communication: none  today Disposition Plan: Home   Consultants:   none  Procedures: none Antimicrobials: remdesevir (12/25-12/30)  Subjective: Seen and examined No acute events Titrated to 3L [place] Feels better  Objective: Vitals:   09/11/19 0247 09/11/19 0743 09/11/19 1135 09/11/19 1145  BP: (!) 142/96 128/80    Pulse: 79 76    Resp: 18 18    Temp: 97.7 F (36.5 C) 97.6 F (36.4 C)    TempSrc: Oral Oral    SpO2: 90% 93% (!) 81% 91%  Weight: 134.3 kg     Height:        Intake/Output Summary (Last 24 hours) at 09/11/2019 1243 Last data filed at 09/10/2019 2200 Gross per 24 hour  Intake --  Output 350 ml  Net -350 ml   Filed Weights   09/05/19 0024 09/11/19 0247  Weight: 124.7 kg 134.3 kg    Examination:  General exam: Appears calm and comfortable  Respiratory system: Scattered crackles.  Normal respiratory effort.  Speaking in complete sentences Cardiovascular system: S1 & S2 heard, RRR. No JVD, murmurs, rubs, gallops or clicks. No pedal edema. Gastrointestinal system: Abdomen is nondistended, soft and nontender. No organomegaly or masses felt. Normal bowel sounds heard. Central nervous system: Alert and oriented. No focal neurological deficits. Extremities: Symmetric 5 x 5 power. Skin: No rashes, lesions or ulcers Psychiatry: Judgement and insight appear normal. Mood & affect appropriate.     Data Reviewed: I have personally reviewed following labs and imaging studies  CBC: Recent Labs  Lab 09/06/19 0451 09/07/19 0509 09/08/19 0604 09/09/19 0507 09/10/19 0454  WBC  13.1* 17.4* 15.8* 13.2* 13.4*  NEUTROABS 11.8* 15.8* 14.2* 11.9* 11.8*  HGB 15.7 15.4 16.2 16.3 16.9  HCT 44.8 44.4 47.3 44.7 48.8  MCV 88.4 88.8 88.9 84.2 88.4  PLT 383 400 401* 381 387   Basic Metabolic Panel: Recent Labs  Lab 09/06/19 0451 09/07/19 0509 09/08/19 0604 09/09/19 0507 09/10/19 0454  NA 140 140 136 133* 133*  K 3.2* 3.9 4.5 4.7 4.8  CL 101 104 100 99 96*  CO2 24 23 23 24 27    GLUCOSE 254* 233* 223* 286* 239*  BUN 25* 30* 31* 32* 35*  CREATININE 0.77 0.75 0.87 0.84 0.78  CALCIUM 8.9 8.8* 8.9 8.7* 8.6*  MG 2.5* 2.4 2.6* 2.4 2.4  PHOS 4.5 4.2 4.6 4.5 4.8*   GFR: Estimated Creatinine Clearance: 156.1 mL/min (by C-G formula based on SCr of 0.78 mg/dL). Liver Function Tests: Recent Labs  Lab 09/06/19 0451 09/07/19 0509 09/08/19 0604 09/09/19 0507 09/10/19 0454  AST 30 27 29 26 22   ALT 32 33 42 44 43  ALKPHOS 56 49 54 54 47  BILITOT 0.8 0.6 0.9 1.0 1.0  PROT 7.2 6.8 7.2 6.7 6.7  ALBUMIN 3.1* 3.0* 3.0* 3.1* 3.0*   No results for input(s): LIPASE, AMYLASE in the last 168 hours. No results for input(s): AMMONIA in the last 168 hours. Coagulation Profile: No results for input(s): INR, PROTIME in the last 168 hours. Cardiac Enzymes: No results for input(s): CKTOTAL, CKMB, CKMBINDEX, TROPONINI in the last 168 hours. BNP (last 3 results) No results for input(s): PROBNP in the last 8760 hours. HbA1C: No results for input(s): HGBA1C in the last 72 hours. CBG: Recent Labs  Lab 09/10/19 1155 09/10/19 1639 09/10/19 2154 09/11/19 0745 09/11/19 1144  GLUCAP 222* 189* 231* 285* 208*   Lipid Profile: No results for input(s): CHOL, HDL, LDLCALC, TRIG, CHOLHDL, LDLDIRECT in the last 72 hours. Thyroid Function Tests: No results for input(s): TSH, T4TOTAL, FREET4, T3FREE, THYROIDAB in the last 72 hours. Anemia Panel: Recent Labs    09/09/19 0507 09/10/19 0454  FERRITIN 583* 617*   Sepsis Labs: Recent Labs  Lab 09/05/19 0033  PROCALCITON 0.12  LATICACIDVEN 1.8    Recent Results (from the past 240 hour(s))  Blood Culture (routine x 2)     Status: None   Collection Time: 09/05/19 12:34 AM   Specimen: BLOOD  Result Value Ref Range Status   Specimen Description BLOOD LEFT ANTECUBITAL  Final   Special Requests   Final    BOTTLES DRAWN AEROBIC AND ANAEROBIC Blood Culture results may not be optimal due to an excessive volume of blood received in  culture bottles   Culture   Final    NO GROWTH 5 DAYS Performed at [facility], [address] [place], [address] [place]    Report Status 09/10/2019 FINAL  Final  Blood Culture (routine x 2)     Status: None   Collection Time: 09/05/19 12:34 AM   Specimen: BLOOD  Result Value Ref Range Status   Specimen Description BLOOD LEFT ARM  Final   Special Requests   Final    BOTTLES DRAWN AEROBIC AND ANAEROBIC Blood Culture adequate volume   Culture   Final    NO GROWTH 5 DAYS Performed at [facility], [address], [place], [address] [place]    Report Status 09/10/2019 FINAL  Final  MRSA PCR Screening     Status: None   Collection Time: 09/06/19  9:21 PM   Specimen: Nasal  Mucosa; Nasopharyngeal  Result Value Ref Range Status   MRSA by PCR NEGATIVE NEGATIVE Final    Comment:        The GeneXpert MRSA Assay (FDA approved for NASAL specimens only), is one component of a comprehensive MRSA colonization surveillance program. It is not intended to diagnose MRSA infection nor to guide or monitor treatment for MRSA infections. Performed at [facility], [address] [place], [place] [address]          Radiology Studies: No results found.      Scheduled Meds: . albuterol  2 puff Inhalation Q6H  . vitamin C  500 mg Oral Daily  . enoxaparin (LOVENOX) injection  0.5 mg/kg Subcutaneous Q12H  . famotidine  20 mg Oral Daily  . hydrochlorothiazide  50 mg Oral Daily  . insulin aspart  0-15 Units Subcutaneous TID WC  . insulin aspart  6 Units Subcutaneous TID WC  . insulin glargine  25 Units Subcutaneous Daily  . methylPREDNISolone (SOLU-MEDROL) injection  0.5 mg/kg Intravenous Q12H  . multivitamin with minerals  1 tablet Oral Daily  . zinc sulfate  220 mg Oral Daily   Continuous Infusions:   LOS: 6 days    Time spent: 35 minutes    Tresa Moore, MD [facility] Pager [phone]  If 7PM-7AM, please contact  night-coverage [email or web address] Password [facility] 09/11/2019, 12:43 PM

## 2019-09-11 NOTE — Progress Notes (Signed)
Patient received to room 231. Orientated to Staff, room, and equipment. Telephone, personal items, and Call light within reach.

## 2019-09-11 NOTE — Progress Notes (Signed)
RN was called by central telemetry stating that the patients HR was sustaining in the 150's.  Went into the room and patient visually appeared ok.  He stated he had just walked to the bathroom (with oxygen).  I took his vitals and o2 saturations were in the low 80's.  HR continued to be high in 140's for several minutes.  Encouraged him to do some deep breathing to get 02 levels back up and HR back to normal.  He never appeared in any distress and I educated him to activity limitations.  Will continue to monitor oxygen needs.

## 2019-09-12 LAB — GLUCOSE, CAPILLARY
Glucose-Capillary: 270 mg/dL — ABNORMAL HIGH (ref 70–99)
Glucose-Capillary: 230 mg/dL — ABNORMAL HIGH (ref 70–99)
Glucose-Capillary: 247 mg/dL — ABNORMAL HIGH (ref 70–99)

## 2019-09-12 LAB — CREATININE, SERUM
Creatinine, Ser: 0.91 mg/dL (ref 0.61–1.24)
GFR calc Af Amer: >60 mL/min (ref >60)
GFR calc non Af Amer: >60 mL/min (ref >60)

## 2019-09-12 MED ORDER — ASCORBIC ACID 500 MG PO TABS: 500.0000 mg | ORAL_TABLET | Freq: Every day | ORAL | 0 refills | Status: DC

## 2019-09-12 MED ORDER — DEXAMETHASONE 6 MG PO TABS: 6.0000 mg | ORAL_TABLET | Freq: Four times a day (QID) | ORAL | 0 refills | Status: AC

## 2019-09-12 MED ORDER — ADULT MULTIVITAMIN W/MINERALS CH: 1.0000 | ORAL_TABLET | Freq: Every day | ORAL | 0 refills | Status: AC

## 2019-09-12 MED ORDER — ZINC SULFATE 220 (50 ZN) MG PO CAPS: 220.0000 mg | ORAL_CAPSULE | Freq: Every day | ORAL | 0 refills | Status: DC

## 2019-09-12 MED ORDER — HYDROCHLOROTHIAZIDE 50 MG PO TABS: 50.0000 mg | ORAL_TABLET | Freq: Every day | ORAL | 0 refills | Status: AC

## 2019-09-12 NOTE — Progress Notes (Signed)
Patient's O2 saturation while resting is 93% on 2.5L. Patient's O2 saturation while ambulating dipped to 85% on 2.5L. Patient's O2 saturation after 2 minutes recovery back to 89% on 2.5L. Patient's O2 saturation after 5 minutes recovery 93% on 2.5L.

## 2019-09-12 NOTE — Progress Notes (Signed)
End of Shift Summary:  Date: 09/12/19 Shift: 0700-1900  COVID19: Positive Significant Events: Patient states he is ready to go home. Up ad lib, oriented x4, in good spirits. VSS; no significant events. Discharge pending oxygen delivery.

## 2019-09-12 NOTE — TOC Transition Note (Signed)
Transition of Care [facility]) - CM/SW Discharge Note   Patient Details  Name: Ian Pennington MRN: [id number] Date of Birth: 01/12/1966  [facility]) CM/SW Contact:  Darleene Cleaver, LCSW Phone Number: 09/12/2019, 7:05 PM   Clinical Narrative:    Patient will be discharging back home with oxygen.  CSW spoke to [place] from [employer], and he will have oxygen delivered however, it will not be till later in the evening.  Patient does not have any insurance, financial hardship given to patient.  CSW tried to contact [place] at [facility] since they are charity this week, awaiting for call back from her.   Final next level of care: Home w Home Health Services Barriers to Discharge: Equipment Delay   Patient Goals and CMS Choice Patient states their goals for this hospitalization and ongoing recovery are:: To return back home with oxygen CMS Medicare.gov Compare Post Acute Care list provided to:: Patient Choice offered to / list presented to : Patient  Discharge Placement  Patient discharging back home.                     Discharge Plan and Services  Patient going home with oxygen.              DME Arranged: Oxygen DME Agency: [employer] Date DME Agency Contacted: 09/12/19 Time DME Agency Contacted: 1500 Representative spoke with at DME Agency: Nida Boatman HH Arranged: RN HH Agency: [facility] Date [facility] Agency Contacted: 09/12/19 Time HH Agency Contacted: 1430 Representative spoke with at [facility] Agency: [place]  Social Determinants of Health (SDOH) Interventions     Readmission Risk Interventions No flowsheet data found.

## 2019-09-12 NOTE — Discharge Summary (Signed)
Physician Discharge Summary  Ian Pennington SEG:[id number] DOB: 20-Jan-1966 DOA: 09/05/2019  PCP: Patient, No Pcp Per  Admit date: 09/05/2019 Discharge date: 09/12/2019  Admitted From: Home Disposition:  Home  Recommendations for Outpatient Follow-up:  1. Follow up with PCP in 1-2 weeks 2.   Home Health: Yes Equipment/Devices:Oxygen 2L Discharge Condition:Stable CODE STATUS:Full Diet recommendation: Heart Healthy  Brief/Interim Summary: Saahil Herbster Harlowis a 54 y.o.Caucasian malewithno significant past medical history who tested positive for Covid a week prior who presented to the emergency room EMS with 10-day history ofshortness of breath has been progressively worsening.  12/31: Patient is on 6 L nasal cannula.  Starting to feel better.  Refused transfer to [facility].  1/1: Patient weaned down to 3 L nasal cannula.  Clinically improving.  1/2: Patient symptomatically improved.  Unable to wean from nasal cannula.  Feels "great"  Discharge Diagnoses:  Active Problems:   Acute respiratory failure with hypoxia (HCC)   Pneumonia due to COVID-19 virus  # Acute respiratory failure with hypoxia 2/2 # Pneumonia due to COVID-19 virus # Likely obesity hypoventilation syndrome --CXR showed "Diffuse bilateral airspace opacities". Has been on High flow with minimum 7L requirement sating 91-93%, but easily desat with minimum exertion (or [place] falling out or sleeping) and slow to recover, requiring O2 to be turned up to 10-15L. Pt likely has shallow breathing at baseline. Today at 10L sating ~91%, stable. --procal neg, abx d/c'ed. - 1/1: titrated to 3L, off the high flow [place] PLAN: --Completed remdesevir 5 days - Continue Decadron (day 8 of 10).  Complete course at home --vit and zinc, prescribed on discharge --No home PT recommended --Home oxygen prescribed on discharge --Establish care with and FU with PCP  # Elevated BP, --No hx of HTN and not on BP meds at  home PLAN: --continueHCTZ to 50 mg daily  --Prescribed on discharge --Follow up with PCP  # Hyperglycemia # Likely undiagnosed DM2 --A1c 6.5. BG elevated also due to steroids. PLAN: - Diabetic coordinator following - Lantus 25U daily - Novolog 6U TID WM --Will not prescribe insulin regimen on discharge --Recommend patient to establish care with PCP and re-assess need for anti-DM meds --May be a good candidate for oral DM medications  Discharge Instructions  Discharge Instructions    AMB referral to pulmonary rehabilitation   Complete by: As directed    Please select a program: Respiratory Care Services   Respiratory Care Services Diagnosis: Dyspnea   After initial evaluation and assessments completed: Virtual Based Care may be provided alone or in conjunction with Pulmonary Rehab/Respiratory Care services based on patient barriers.: Yes   Call MD for:  difficulty breathing, headache or visual disturbances   Complete by: As directed    Call MD for:  extreme fatigue   Complete by: As directed    Call MD for:  persistant dizziness or light-headedness   Complete by: As directed    Call MD for:  temperature >100.4   Complete by: As directed    Diet - low sodium heart healthy   Complete by: As directed    Discharge instructions   Complete by: As directed    Isolate for 7-10 days   Increase activity slowly   Complete by: As directed    MyChart COVID-19 home monitoring program   Complete by: Sep 12, 2019    Is the patient willing to use the MyChart Mobile App for home monitoring?: Yes   Temperature monitoring   Complete by: Sep 12, 2019    After how many days would you like to receive a notification of this patient's flowsheet entries?: 1     Allergies as of 09/12/2019   No Known Allergies     Medication List    STOP taking these medications   HYDROcodone-acetaminophen 5-325 MG tablet Commonly known as: Norco     TAKE these medications   ascorbic acid 500 MG  tablet Commonly known as: VITAMIN C Take 1 tablet (500 mg total) by mouth daily. Start taking on: September 13, 2019   dexamethasone 6 MG tablet Commonly known as: Decadron Take 1 tablet (6 mg total) by mouth every 6 (six) hours for 3 days.   hydrochlorothiazide 50 MG tablet Commonly known as: HYDRODIURIL Take 1 tablet (50 mg total) by mouth daily. Start taking on: September 13, 2019   multivitamin with minerals Tabs tablet Take 1 tablet by mouth daily. Start taking on: September 13, 2019   zinc sulfate 220 (50 Zn) MG capsule Take 1 capsule (220 mg total) by mouth daily. Start taking on: September 13, 2019            Durable Medical Equipment  (From admission, onward)         Start     Ordered   09/11/19 1625  For home use only DME oxygen  Once    Question Answer Comment  Length of Need 6 Months   Mode or (Route) Nasal cannula   Liters per Minute 3   Frequency Continuous (stationary and portable oxygen unit needed)   Oxygen conserving device Yes   Oxygen delivery system Gas      09/11/19 1624          No Known Allergies  Consultations:  none   Procedures/Studies: DG Chest Port 1 View  Result Date: 09/05/2019 CLINICAL DATA:  Respiratory distress. COVID-19 EXAM: PORTABLE CHEST 1 VIEW COMPARISON:  None FINDINGS: Mild cardiomegaly. Diffuse bilateral airspace opacities, likely multifocal pneumonia. No pleural effusion or pneumothorax. IMPRESSION: Diffuse bilateral airspace opacities, likely multifocal pneumonia. Electronically Signed   By: Ulyses Jarred M.D.   On: 09/05/2019 01:03       Subjective: Seen and examined on the day of discharge Feels well, ready to go home No complaints All questions answered  Discharge Exam: Vitals:   09/12/19 0317 09/12/19 0815  BP: 127/87 132/74  Pulse: 76 73  Resp:  18  Temp: 97.6 F (36.4 C) 97.7 F (36.5 C)  SpO2: 92% 96%   Vitals:   09/11/19 2004 09/11/19 2230 09/12/19 0317 09/12/19 0815  BP: (!) 123/93  127/87  132/74  Pulse: 100  76 73  Resp:    18  Temp: 97.8 F (36.6 C)  97.6 F (36.4 C) 97.7 F (36.5 C)  TempSrc: Oral  Oral Oral  SpO2: 91% 91% 92% 96%  Weight:      Height:       General exam: Appears calm and comfortable  Respiratory system: Scattered crackles.  Normal respiratory effort.  Speaking in complete sentences Cardiovascular system: S1 & S2 heard, RRR. No JVD, murmurs, rubs, gallops or clicks. No pedal edema. Gastrointestinal system: Abdomen is nondistended, soft and nontender. No organomegaly or masses felt. Normal bowel sounds heard. Central nervous system: Alert and oriented. No focal neurological deficits. Extremities: Symmetric 5 x 5 power. Skin: No rashes, lesions or ulcers Psychiatry: Judgement and insight appear normal. Mood & affect appropriate.     The results of significant diagnostics from this hospitalization (including imaging,  microbiology, ancillary and laboratory) are listed below for reference.     Microbiology: Recent Results (from the past 240 hour(s))  Blood Culture (routine x 2)     Status: None   Collection Time: 09/05/19 12:34 AM   Specimen: BLOOD  Result Value Ref Range Status   Specimen Description BLOOD LEFT ANTECUBITAL  Final   Special Requests   Final    BOTTLES DRAWN AEROBIC AND ANAEROBIC Blood Culture results may not be optimal due to an excessive volume of blood received in culture bottles   Culture   Final    NO GROWTH 5 DAYS Performed at [facility], [address] [place], [place] [address]    Report Status 09/10/2019 FINAL  Final  Blood Culture (routine x 2)     Status: None   Collection Time: 09/05/19 12:34 AM   Specimen: BLOOD  Result Value Ref Range Status   Specimen Description BLOOD LEFT ARM  Final   Special Requests   Final    BOTTLES DRAWN AEROBIC AND ANAEROBIC Blood Culture adequate volume   Culture   Final    NO GROWTH 5 DAYS Performed at [facility], [address] [place], [place]  [address]    Report Status 09/10/2019 FINAL  Final  MRSA PCR Screening     Status: None   Collection Time: 09/06/19  9:21 PM   Specimen: Nasal Mucosa; Nasopharyngeal  Result Value Ref Range Status   MRSA by PCR NEGATIVE NEGATIVE Final    Comment:        The GeneXpert MRSA Assay (FDA approved for NASAL specimens only), is one component of a comprehensive MRSA colonization surveillance program. It is not intended to diagnose MRSA infection nor to guide or monitor treatment for MRSA infections. Performed at [facility], [address] [place], [place] [address]      Labs: BNP (last 3 results) No results for input(s): BNP in the last 8760 hours. Basic Metabolic Panel: Recent Labs  Lab 09/06/19 0451 09/07/19 0509 09/08/19 0604 09/09/19 0507 09/10/19 0454 09/12/19 0424  NA 140 140 136 133* 133*  --   K 3.2* 3.9 4.5 4.7 4.8  --   CL 101 104 100 99 96*  --   CO2 24 23 23 24 27   --   GLUCOSE 254* 233* 223* 286* 239*  --   BUN 25* 30* 31* 32* 35*  --   CREATININE 0.77 0.75 0.87 0.84 0.78 0.91  CALCIUM 8.9 8.8* 8.9 8.7* 8.6*  --   MG 2.5* 2.4 2.6* 2.4 2.4  --   PHOS 4.5 4.2 4.6 4.5 4.8*  --    Liver Function Tests: Recent Labs  Lab 09/06/19 0451 09/07/19 0509 09/08/19 0604 09/09/19 0507 09/10/19 0454  AST 30 27 29 26 22   ALT 32 33 42 44 43  ALKPHOS 56 49 54 54 47  BILITOT 0.8 0.6 0.9 1.0 1.0  PROT 7.2 6.8 7.2 6.7 6.7  ALBUMIN 3.1* 3.0* 3.0* 3.1* 3.0*   No results for input(s): LIPASE, AMYLASE in the last 168 hours. No results for input(s): AMMONIA in the last 168 hours. CBC: Recent Labs  Lab 09/06/19 0451 09/07/19 0509 09/08/19 0604 09/09/19 0507 09/10/19 0454  WBC 13.1* 17.4* 15.8* 13.2* 13.4*  NEUTROABS 11.8* 15.8* 14.2* 11.9* 11.8*  HGB 15.7 15.4 16.2 16.3 16.9  HCT 44.8 44.4 47.3 44.7 48.8  MCV 88.4 88.8 88.9 84.2 88.4  PLT 383 400 401* 381 387   Cardiac Enzymes: No results  for input(s): CKTOTAL, CKMB, CKMBINDEX, TROPONINI in the last 168  hours. BNP: Invalid input(s): POCBNP CBG: Recent Labs  Lab 09/11/19 1144 09/11/19 1646 09/11/19 2005 09/12/19 0813 09/12/19 1144  GLUCAP 208* 189* 226* 270* 230*   D-Dimer No results for input(s): DDIMER in the last 72 hours. Hgb A1c No results for input(s): HGBA1C in the last 72 hours. Lipid Profile No results for input(s): CHOL, HDL, LDLCALC, TRIG, CHOLHDL, LDLDIRECT in the last 72 hours. Thyroid function studies No results for input(s): TSH, T4TOTAL, T3FREE, THYROIDAB in the last 72 hours.  Invalid input(s): FREET3 Anemia work up Recent Labs    09/10/19 0454  FERRITIN 617*   Urinalysis    Component Value Date/Time   COLORURINE AMBER (A) 04/12/2015 0718   APPEARANCEUR CLEAR (A) 04/12/2015 0718   LABSPEC 1.021 04/12/2015 0718   PHURINE 6.0 04/12/2015 0718   GLUCOSEU NEGATIVE 04/12/2015 0718   HGBUR NEGATIVE 04/12/2015 0718   BILIRUBINUR NEGATIVE 04/12/2015 0718   KETONESUR NEGATIVE 04/12/2015 0718   PROTEINUR 30 (A) 04/12/2015 0718   NITRITE NEGATIVE 04/12/2015 0718   LEUKOCYTESUR TRACE (A) 04/12/2015 0718   Sepsis Labs Invalid input(s): PROCALCITONIN,  WBC,  LACTICIDVEN Microbiology Recent Results (from the past 240 hour(s))  Blood Culture (routine x 2)     Status: None   Collection Time: 09/05/19 12:34 AM   Specimen: BLOOD  Result Value Ref Range Status   Specimen Description BLOOD LEFT ANTECUBITAL  Final   Special Requests   Final    BOTTLES DRAWN AEROBIC AND ANAEROBIC Blood Culture results may not be optimal due to an excessive volume of blood received in culture bottles   Culture   Final    NO GROWTH 5 DAYS Performed at [facility], [address] [place], [place] [address]    Report Status 09/10/2019 FINAL  Final  Blood Culture (routine x 2)     Status: None   Collection Time: 09/05/19 12:34 AM   Specimen: BLOOD  Result Value Ref Range Status   Specimen Description BLOOD LEFT ARM  Final   Special Requests   Final    BOTTLES DRAWN  AEROBIC AND ANAEROBIC Blood Culture adequate volume   Culture   Final    NO GROWTH 5 DAYS Performed at [facility], [address] [place], [place] [address]    Report Status 09/10/2019 FINAL  Final  MRSA PCR Screening     Status: None   Collection Time: 09/06/19  9:21 PM   Specimen: Nasal Mucosa; Nasopharyngeal  Result Value Ref Range Status   MRSA by PCR NEGATIVE NEGATIVE Final    Comment:        The GeneXpert MRSA Assay (FDA approved for NASAL specimens only), is one component of a comprehensive MRSA colonization surveillance program. It is not intended to diagnose MRSA infection nor to guide or monitor treatment for MRSA infections. Performed at [facility], [address] [place], [place] [address]      Time coordinating discharge: Over 30 minutes  SIGNED:   Tresa Moore, MD  [facility] 09/12/2019, 3:31 PM Pager [phone]  If 7PM-7AM, please contact night-coverage [email or web address] Password [email or web address]

## 2019-09-12 NOTE — Evaluation (Signed)
Occupational Therapy Evaluation Patient Details Name: Ian Pennington MRN: [id number] DOB: 05-29-1966 Today's Date: 09/12/2019    History of Present Illness presented to ER secondary to SOB, hypoxia; admitted for management of acute respiratory failure with hypoxia, multifocal PNA due to COVID-19 virus. Initially requiring HFNC, now weaned to standard [place] but with significant desat with exertion.  Has refused transfer to [facility]   Clinical Impression   Mr. Mccannon was seen for OT evaluation this date. Pt was independent in all ADLs and mobility. 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 Per nursing, pt has been up ad lib in room, using the bathroom independently, taken a shower independently, but fatigues quickly and has difficulty recognizing when to take a break. Pt received seated at EOB with meal tray and nasal cannula ([place]) removed. Pt educated in energy conservation conservation strategies including pursed lip breathing, activity pacing, home/routines modifications, work simplification, AE/DME, prioritizing of meaningful occupations, and falls prevention. OT engages pt in functional lower body dressing activity and encourages pt to don [place] during activity to promote adequate oxygen saturation with exertion. Pt dons  and implements PLB energy conservation strategy throughout task. Is able to don bilat hospital socks independently  without sings/symptons of desaturation during task. Pt verbalized understanding of remaining energy conservation strategies. No further skilled acute OT needs identified. Will sign off. Please re-consult if additional OT needs arise during this admission. Do not anticipate need for follow-up OT services.     Follow Up Recommendations  No OT follow up    Equipment Recommendations  None recommended by OT    Recommendations for Other Services       Precautions / Restrictions Precautions Precautions:  None Restrictions Weight Bearing Restrictions: No      Mobility Bed Mobility Overal bed mobility: Independent                Transfers Overall transfer level: Independent                    Balance Overall balance assessment: No apparent balance deficits (not formally assessed) Sitting-balance support: Feet supported;No upper extremity supported Sitting balance-Leahy Scale: Normal Sitting balance - Comments: Steady static/dynamic sitting, reaching within BOS.   Standing balance support: No upper extremity supported Standing balance-Leahy Scale: Good                             ADL either performed or assessed with clinical judgement   ADL Overall ADL's : Independent;At baseline                                       General ADL Comments: Pt reports being up ad lib in room, using the bathroom, taking a shower, etc. Pt educated on energy conservation strategies during functional task of donning hospital socks. Pt return demosntrates understanding of PLB and return verbalizes understanding of additional ECS education. No further skilled needs identified. At baseline level to perform ADL tasks with supplemental O2.     Vision Patient Visual Report: No change from baseline       Perception     Praxis      Pertinent Vitals/Pain Pain Assessment: No/denies pain     Hand Dominance Left   Extremity/Trunk Assessment Upper Extremity Assessment Upper Extremity Assessment: Overall WFL for tasks assessed  Lower Extremity Assessment Lower Extremity Assessment: Overall WFL for tasks assessed   Cervical / Trunk Assessment Cervical / Trunk Assessment: Normal   Communication Communication Communication: No difficulties   Cognition Arousal/Alertness: Awake/alert Behavior During Therapy: WFL for tasks assessed/performed Overall Cognitive Status: Within Functional Limits for tasks assessed                                      General Comments       Exercises Other Exercises Other Exercises: Pt educated in energy conservation strategies including pursed lip breathing, activity pacing, routines modifications, and prioritizing of meaningful occupations to promote safety, functional independence and satisfaction with occupational performance upon hospital DC. Pt educated on importance of using [place] during activity and monitoring for signs/symptoms of fatigue/desaturation (SOB, elevated HR, etc.) during functional activity. Pt return verbalized/demonstrated understanding of education provided.   Shoulder Instructions      Home Living Family/patient expects to be discharged to:: Private residence Living Arrangements: Spouse/significant other                           Home Equipment: None          Prior Functioning/Environment Level of Independence: Independent        Comments: Indep with ADLS, household and community mobilization without assist device; denies fall history; no home O2.  Employeed as Production assistant, radio.        OT Problem List: Cardiopulmonary status limiting activity;Decreased knowledge of use of DME or AE;Decreased safety awareness;Decreased activity tolerance      OT Treatment/Interventions:      OT Goals(Current goals can be found in the care plan section) Acute Rehab OT Goals Patient Stated Goal: to get myself better so I can get back to work OT Goal Formulation: All assessment and education complete, DC therapy Time For Goal Achievement: 09/12/19 Potential to Achieve Goals: Good  OT Frequency:     Barriers to D/C:            Co-evaluation              AM-PAC OT "6 Clicks" Daily Activity     Outcome Measure Help from another person eating meals?: None Help from another person taking care of personal grooming?: None Help from another person toileting, which includes using toliet, bedpan, or urinal?: None Help from another person bathing (including  washing, rinsing, drying)?: None Help from another person to put on and taking off regular upper body clothing?: None Help from another person to put on and taking off regular lower body clothing?: None 6 Click Score: 24   End of Session Equipment Utilized During Treatment: Oxygen  Activity Tolerance: Patient tolerated treatment well Patient left: in bed;with call bell/phone within reach  OT Visit Diagnosis: Other abnormalities of gait and mobility (R26.89)                Time: 1251-1311 OT Time Calculation (min): 20 min Charges:  OT General Charges $OT Visit: 1 Visit OT Evaluation $OT Eval Moderate Complexity: 1 Mod OT Treatments $Self Care/Home Management : 8-22 mins  Shara Blazing, M.S., OTR/L Ascom: [phone] 09/12/19, 1:30 PM

## 2019-10-09 ENCOUNTER — Encounter: Payer: Self-pay | Admitting: Emergency Medicine

## 2019-10-09 ENCOUNTER — Emergency Department: Payer: Self-pay

## 2019-10-09 ENCOUNTER — Other Ambulatory Visit: Payer: Self-pay

## 2019-10-09 ENCOUNTER — Inpatient Hospital Stay
Admission: EM | Admit: 2019-10-09 | Discharge: 2019-10-13 | DRG: 638 | Disposition: A | Discharge status: Home Health | Payer: Self-pay | Attending: Internal Medicine | Admitting: Internal Medicine

## 2019-10-09 DIAGNOSIS — Z87891 Personal history of nicotine dependence: Secondary | ICD-10-CM

## 2019-10-09 DIAGNOSIS — E860 Dehydration: Secondary | ICD-10-CM | POA: Diagnosis present

## 2019-10-09 DIAGNOSIS — Z8616 Personal history of COVID-19: Secondary | ICD-10-CM

## 2019-10-09 DIAGNOSIS — Z79899 Other long term (current) drug therapy: Secondary | ICD-10-CM

## 2019-10-09 DIAGNOSIS — Z8701 Personal history of pneumonia (recurrent): Secondary | ICD-10-CM

## 2019-10-09 DIAGNOSIS — J1282 Pneumonia due to coronavirus disease 2019: Secondary | ICD-10-CM

## 2019-10-09 DIAGNOSIS — E1165 Type 2 diabetes mellitus with hyperglycemia: Secondary | ICD-10-CM

## 2019-10-09 DIAGNOSIS — M1711 Unilateral primary osteoarthritis, right knee: Secondary | ICD-10-CM | POA: Diagnosis present

## 2019-10-09 DIAGNOSIS — U071 COVID-19: Secondary | ICD-10-CM

## 2019-10-09 DIAGNOSIS — Z833 Family history of diabetes mellitus: Secondary | ICD-10-CM

## 2019-10-09 DIAGNOSIS — I10 Essential (primary) hypertension: Secondary | ICD-10-CM | POA: Diagnosis present

## 2019-10-09 DIAGNOSIS — N179 Acute kidney failure, unspecified: Secondary | ICD-10-CM

## 2019-10-09 DIAGNOSIS — E1100 Type 2 diabetes mellitus with hyperosmolarity without nonketotic hyperglycemic-hyperosmolar coma (NKHHC): Principal | ICD-10-CM | POA: Diagnosis present

## 2019-10-09 LAB — OSMOLALITY
Osmolality: 365 mOsm/kg (ref 275–295)
Osmolality: 338 mOsm/kg (ref 275–295)

## 2019-10-09 LAB — BASIC METABOLIC PANEL
Anion gap: 10 (ref 5–15)
Anion gap: 11 (ref 5–15)
Anion gap: 8 (ref 5–15)
BUN: 26 mg/dL — ABNORMAL HIGH (ref 6–20)
BUN: 24 mg/dL — ABNORMAL HIGH (ref 6–20)
BUN: 19 mg/dL (ref 6–20)
CO2: 29 mmol/L (ref 22–32)
CO2: 28 mmol/L (ref 22–32)
CO2: 26 mmol/L (ref 22–32)
Calcium: 9.2 mg/dL (ref 8.9–10.3)
Calcium: 9.4 mg/dL (ref 8.9–10.3)
Calcium: 8.7 mg/dL — ABNORMAL LOW (ref 8.9–10.3)
Chloride: 102 mmol/L (ref 98–111)
Chloride: 102 mmol/L (ref 98–111)
Chloride: 110 mmol/L (ref 98–111)
Creatinine, Ser: 1.1 mg/dL (ref 0.61–1.24)
Creatinine, Ser: 1.08 mg/dL (ref 0.61–1.24)
Creatinine, Ser: 0.97 mg/dL (ref 0.61–1.24)
GFR calc Af Amer: >60 mL/min (ref >60)
GFR calc Af Amer: >60 mL/min (ref >60)
GFR calc Af Amer: >60 mL/min (ref >60)
GFR calc non Af Amer: >60 mL/min (ref >60)
GFR calc non Af Amer: >60 mL/min (ref >60)
GFR calc non Af Amer: >60 mL/min (ref >60)
Glucose, Bld: 773 mg/dL (ref 70–99)
Glucose, Bld: 635 mg/dL (ref 70–99)
Glucose, Bld: 291 mg/dL — ABNORMAL HIGH (ref 70–99)
Potassium: 4.6 mmol/L (ref 3.5–5.1)
Potassium: 4.1 mmol/L (ref 3.5–5.1)
Potassium: 3 mmol/L — ABNORMAL LOW (ref 3.5–5.1)
Sodium: 141 mmol/L (ref 135–145)
Sodium: 141 mmol/L (ref 135–145)
Sodium: 144 mmol/L (ref 135–145)

## 2019-10-09 LAB — BLOOD GAS, VENOUS
Acid-Base Excess: 0.8 mmol/L (ref 0.0–2.0)
Bicarbonate: 26.6 mmol/L (ref 20.0–28.0)
O2 Saturation: 85.3 %
Patient temperature: 37
pCO2, Ven: 46 mmHg (ref 44.0–60.0)
pH, Ven: 7.37 (ref 7.250–7.430)

## 2019-10-09 LAB — CBC
HCT: 45.7 % (ref 39.0–52.0)
Hemoglobin: 14.4 g/dL (ref 13.0–17.0)
MCH: 30.2 pg (ref 26.0–34.0)
MCHC: 31.5 g/dL (ref 30.0–36.0)
MCV: 95.8 fL (ref 80.0–100.0)
Platelets: 336 10*3/uL (ref 150–400)
RBC: 4.77 MIL/uL (ref 4.22–5.81)
RDW: 13.6 % (ref 11.5–15.5)
WBC: 7.3 10*3/uL (ref 4.0–10.5)
nRBC: 0 % (ref 0.0–0.2)

## 2019-10-09 LAB — TROPONIN I (HIGH SENSITIVITY)
Troponin I (High Sensitivity): 14 ng/L (ref <18)
Troponin I (High Sensitivity): 16 ng/L (ref <18)

## 2019-10-09 LAB — COMPREHENSIVE METABOLIC PANEL
ALT: 102 U/L — ABNORMAL HIGH (ref 0–44)
AST: 55 U/L — ABNORMAL HIGH (ref 15–41)
Albumin: 3.1 g/dL — ABNORMAL LOW (ref 3.5–5.0)
Alkaline Phosphatase: 61 U/L (ref 38–126)
Anion gap: 14 (ref 5–15)
BUN: 31 mg/dL — ABNORMAL HIGH (ref 6–20)
CO2: 24 mmol/L (ref 22–32)
Calcium: 8.8 mg/dL — ABNORMAL LOW (ref 8.9–10.3)
Chloride: 96 mmol/L — ABNORMAL LOW (ref 98–111)
Creatinine, Ser: 1.34 mg/dL — ABNORMAL HIGH (ref 0.61–1.24)
GFR calc Af Amer: >60 mL/min (ref >60)
GFR calc non Af Amer: 59 mL/min — ABNORMAL LOW (ref >60)
Glucose, Bld: 1460 mg/dL (ref 70–99)
Potassium: 4.9 mmol/L (ref 3.5–5.1)
Sodium: 134 mmol/L — ABNORMAL LOW (ref 135–145)
Total Bilirubin: 1.4 mg/dL — ABNORMAL HIGH (ref 0.3–1.2)
Total Protein: 5.7 g/dL — ABNORMAL LOW (ref 6.5–8.1)

## 2019-10-09 LAB — GLUCOSE, CAPILLARY
Glucose-Capillary: >600 mg/dL (ref 70–99)
Glucose-Capillary: >600 mg/dL (ref 70–99)
Glucose-Capillary: >600 mg/dL (ref 70–99)
Glucose-Capillary: >600 mg/dL (ref 70–99)
Glucose-Capillary: >600 mg/dL (ref 70–99)
Glucose-Capillary: >600 mg/dL (ref 70–99)
Glucose-Capillary: >600 mg/dL (ref 70–99)
Glucose-Capillary: 501 mg/dL (ref 70–99)
Glucose-Capillary: 440 mg/dL — ABNORMAL HIGH (ref 70–99)
Glucose-Capillary: 297 mg/dL — ABNORMAL HIGH (ref 70–99)
Glucose-Capillary: 276 mg/dL — ABNORMAL HIGH (ref 70–99)

## 2019-10-09 LAB — URINALYSIS, COMPLETE (UACMP) WITH MICROSCOPIC
Bacteria, UA: NONE SEEN
Bilirubin Urine: NEGATIVE
Glucose, UA: >=500 mg/dL — AB
Hgb urine dipstick: NEGATIVE
Ketones, ur: 5 mg/dL — AB
Leukocytes,Ua: NEGATIVE
Nitrite: NEGATIVE
Protein, ur: NEGATIVE mg/dL
Specific Gravity, Urine: 1.029 (ref 1.005–1.030)
Squamous Epithelial / HPF: NONE SEEN (ref 0–5)
pH: 5 (ref 5.0–8.0)

## 2019-10-09 LAB — MRSA PCR SCREENING: MRSA by PCR: NEGATIVE

## 2019-10-09 MED ORDER — DEXTROSE 50 % IV SOLN: 0.0000 mL | INTRAVENOUS | Status: DC | PRN

## 2019-10-09 MED ORDER — DM-GUAIFENESIN ER 30-600 MG PO TB12
1.0000 | ORAL_TABLET | Freq: Two times a day (BID) | ORAL | Status: DC
  Administered 2019-10-09 – 2019-10-11 (×6): 1 via ORAL
  Filled 2019-10-09 (×14): qty 1

## 2019-10-09 MED ORDER — ONDANSETRON HCL 4 MG/2ML IJ SOLN
4.0000 mg | Freq: Three times a day (TID) | INTRAMUSCULAR | Status: DC | PRN
  Administered 2019-10-12: 4 mg via INTRAVENOUS
  Filled 2019-10-09: qty 2

## 2019-10-09 MED ORDER — SODIUM CHLORIDE 0.9 % IV BOLUS
2000.0000 mL | Freq: Once | INTRAVENOUS | Status: AC
  Administered 2019-10-09: 2000 mL via INTRAVENOUS

## 2019-10-09 MED ORDER — SODIUM CHLORIDE 0.9 % IV BOLUS
1000.0000 mL | Freq: Once | INTRAVENOUS | Status: AC
  Administered 2019-10-09: 1000 mL via INTRAVENOUS

## 2019-10-09 MED ORDER — SODIUM CHLORIDE 0.9 % IV SOLN: INTRAVENOUS | Status: DC

## 2019-10-09 MED ORDER — ALBUTEROL SULFATE HFA 108 (90 BASE) MCG/ACT IN AERS
2.0000 | INHALATION_SPRAY | RESPIRATORY_TRACT | Status: DC | PRN
  Filled 2019-10-09: qty 6.7

## 2019-10-09 MED ORDER — POTASSIUM CHLORIDE 10 MEQ/100ML IV SOLN
10.0000 meq | INTRAVENOUS | Status: AC
  Administered 2019-10-09 (×2): 10 meq via INTRAVENOUS
  Filled 2019-10-09 (×2): qty 100

## 2019-10-09 MED ORDER — ACETAMINOPHEN 325 MG PO TABS
650.0000 mg | ORAL_TABLET | Freq: Four times a day (QID) | ORAL | Status: DC | PRN
  Administered 2019-10-10: 650 mg via ORAL
  Filled 2019-10-09: qty 2

## 2019-10-09 MED ORDER — ASCORBIC ACID 500 MG PO TABS
500.0000 mg | ORAL_TABLET | Freq: Every day | ORAL | Status: DC
  Administered 2019-10-10 – 2019-10-13 (×4): 500 mg via ORAL
  Filled 2019-10-09 (×4): qty 1

## 2019-10-09 MED ORDER — INSULIN REGULAR(HUMAN) IN NACL 100-0.9 UT/100ML-% IV SOLN
INTRAVENOUS | Status: DC
  Administered 2019-10-09: 6.5 [IU]/h via INTRAVENOUS
  Filled 2019-10-09 (×2): qty 100

## 2019-10-09 MED ORDER — ENOXAPARIN SODIUM 40 MG/0.4ML ~~LOC~~ SOLN
40.0000 mg | SUBCUTANEOUS | Status: DC
  Administered 2019-10-09 – 2019-10-12 (×4): 40 mg via SUBCUTANEOUS
  Filled 2019-10-09 (×4): qty 0.4

## 2019-10-09 MED ORDER — DEXTROSE-NACL 5-0.45 % IV SOLN: INTRAVENOUS | Status: DC

## 2019-10-09 MED ORDER — INSULIN REGULAR(HUMAN) IN NACL 100-0.9 UT/100ML-% IV SOLN: INTRAVENOUS | Status: DC

## 2019-10-09 MED ORDER — ZINC SULFATE 220 (50 ZN) MG PO CAPS
220.0000 mg | ORAL_CAPSULE | Freq: Every day | ORAL | Status: DC
  Administered 2019-10-10 – 2019-10-13 (×4): 220 mg via ORAL
  Filled 2019-10-09 (×4): qty 1

## 2019-10-09 MED ORDER — ADULT MULTIVITAMIN W/MINERALS CH
1.0000 | ORAL_TABLET | Freq: Every day | ORAL | Status: DC
  Administered 2019-10-10 – 2019-10-13 (×4): 1 via ORAL
  Filled 2019-10-09 (×4): qty 1

## 2019-10-09 MED ORDER — HYDRALAZINE HCL 25 MG PO TABS: 25.0000 mg | ORAL_TABLET | Freq: Three times a day (TID) | ORAL | Status: DC | PRN

## 2019-10-09 NOTE — H&P (Signed)
History and Physical    [name] VWU:[id number] DOB: 12-Sep-1965 DOA: 10/09/2019  Referring MD/NP/PA:   PCP: Patient, No Pcp Per   Patient coming from:  The patient is coming from home.  At baseline, pt is independent for most of ADL.        Chief Complaint: Polyuria, polydipsia, generalized weakness  HPI: Ian Pennington is a 54 y.o. male with medical history significant of hypertension, diabetes mellitus (A1c 6.5 on 09/07/2019), allergy, recent COVID-19 infection (12/26-1/2), who presents with polyuria, polydipsia and generalized weakness.  Patient was recently hospitalized from 09/05/19 - 09/12/2019 due to COVID-19 infection.  Patient was treated with remdesivir and steroid.  His symptoms of COVID-19 infection have improved, but still has some dry cough and mild shortness of breath.  No chest pain, fever or chills.  He states that he developed polyuria, body dysuria, blurry vision for more than 3 weeks.  He has generalized weakness, no unilateral numbness or tingling sacrum teeth.  No facial droop or slurred speech. He has nausea and vomiting intermittently.  No diarrhea or abdominal pain.  No dysuria or burning on urination.  ED Course: pt was found to have elevated blood sugar 1460, with anion gap of 14.  WBC 7.3, troponin XIV, urinalysis negative for UTI.  Temperature normal, blood pressure 135/110, tachycardia, tachypnea, oxygen sat 95 to 97% on 2 L nasal cannula oxygen.  Chest x-ray showed significantly improved infiltration compared with previous chest x-ray.  Patient is admitted to stepdown as inpatient.  IV insulin drip was started.  Review of Systems:   General: no fevers, chills, no body weight gain, has poor appetite, has fatigue HEENT: has blurry vision, no hearing changes or sore throat Respiratory: no dyspnea, coughing, wheezing CV: no chest pain, no palpitations GI: no nausea, vomiting, abdominal pain, diarrhea, constipation GU: no dysuria, burning on urination, has  increased urinary frequency, no hematuria  Ext: no leg edema Neuro: no unilateral weakness, numbness, or tingling, no hearing loss Skin: no rash, no skin tear. MSK: No muscle spasm, no deformity, no limitation of range of movement in spin Heme: No easy bruising.  Travel history: No recent long distant travel.  Allergy: No Known Allergies  Past Medical History:  Diagnosis Date  . Allergy     Past Surgical History:  Procedure Laterality Date  . APPENDECTOMY  04/12/2015   Procedure: APPENDECTOMY;  Surgeon: Sherri Rad, MD;  Location: [facility] ORS;  Service: General;;  laparoscopic appendectomy converted to open appendectomy    Social History:  reports that he quit smoking about 10 years ago. He has never used smokeless tobacco. He reports that he does not drink alcohol or use drugs.  Family History:  Family History  Problem Relation Age of Onset  . Diabetes Mother   . Hyperlipidemia Father      Prior to Admission medications   Medication Sig Start Date End Date Taking? Authorizing Provider  ascorbic acid (VITAMIN C) 500 MG tablet Take 1 tablet (500 mg total) by mouth daily. 09/13/19 10/13/19 Yes Sreenath, Sudheer B, MD  hydrochlorothiazide (HYDRODIURIL) 50 MG tablet Take 1 tablet (50 mg total) by mouth daily. 09/13/19 10/13/19 Yes Sreenath, Sudheer B, MD  Multiple Vitamin (MULTIVITAMIN WITH MINERALS) TABS tablet Take 1 tablet by mouth daily. 09/13/19 10/13/19 Yes Sreenath, Sudheer B, MD  zinc sulfate 220 (50 Zn) MG capsule Take 1 capsule (220 mg total) by mouth daily. 09/13/19 10/13/19 Yes Sidney Ace, MD    Physical Exam: Vitals:  10/09/19 1715 10/09/19 1730 10/09/19 1743 10/09/19 1745  BP:  (!) 124/93    Pulse: (!) 101 99  93  Resp: 20 (!) 27  (!) 24  Temp:      TempSrc:      SpO2: 100% 100% 100% 100%  Weight:       General: Not in acute distress.  Dry mucus and membrane HEENT:       Eyes: PERRL, EOMI, no scleral icterus.       ENT: No discharge from the ears and nose, no pharynx  injection, no tonsillar enlargement.        Neck: No JVD, no bruit, no mass felt. Heme: No neck lymph node enlargement. Cardiac: S1/S2, RRR, No murmurs, No gallops or rubs. Respiratory: No rales, wheezing, rhonchi or rubs. GI: Soft, nondistended, nontender, no rebound pain, no organomegaly, BS present. GU: No hematuria Ext: No pitting leg edema bilaterally. 2+DP/PT pulse bilaterally. Musculoskeletal: No joint deformities, No joint redness or warmth, no limitation of ROM in spin. Skin: No rashes.  Neuro: Alert, oriented X3, cranial nerves II-XII grossly intact, moves all extremities normally.  Psych: Patient is not psychotic, no suicidal or hemocidal ideation.  Labs on Admission: I have personally reviewed following labs and imaging studies  CBC: Recent Labs  Lab 10/09/19 1204  WBC 7.3  HGB 14.4  HCT 45.7  MCV 95.8  PLT 336   Basic Metabolic Panel: Recent Labs  Lab 10/09/19 1204 10/09/19 1729  NA 134* 141  K 4.9 4.6  CL 96* 102  CO2 24 29  GLUCOSE 1,460* 773*  BUN 31* 26*  CREATININE 1.34* 1.10  CALCIUM 8.8* 9.2   GFR: Estimated Creatinine Clearance: 113.5 mL/min (by C-G formula based on SCr of 1.1 mg/dL). Liver Function Tests: Recent Labs  Lab 10/09/19 1204  AST 55*  ALT 102*  ALKPHOS 61  BILITOT 1.4*  PROT 5.7*  ALBUMIN 3.1*   No results for input(s): LIPASE, AMYLASE in the last 168 hours. No results for input(s): AMMONIA in the last 168 hours. Coagulation Profile: No results for input(s): INR, PROTIME in the last 168 hours. Cardiac Enzymes: No results for input(s): CKTOTAL, CKMB, CKMBINDEX, TROPONINI in the last 168 hours. BNP (last 3 results) No results for input(s): PROBNP in the last 8760 hours. HbA1C: No results for input(s): HGBA1C in the last 72 hours. CBG: Recent Labs  Lab 10/09/19 1303 10/09/19 1444 10/09/19 1547 10/09/19 1648 10/09/19 1732  GLUCAP >600* >600* >600* >600* >600*   Lipid Profile: No results for input(s): CHOL, HDL,  LDLCALC, TRIG, CHOLHDL, LDLDIRECT in the last 72 hours. Thyroid Function Tests: No results for input(s): TSH, T4TOTAL, FREET4, T3FREE, THYROIDAB in the last 72 hours. Anemia Panel: No results for input(s): VITAMINB12, FOLATE, FERRITIN, TIBC, IRON, RETICCTPCT in the last 72 hours. Urine analysis:    Component Value Date/Time   COLORURINE STRAW (A) 10/09/2019 1204   APPEARANCEUR CLEAR (A) 10/09/2019 1204   LABSPEC 1.029 10/09/2019 1204   PHURINE 5.0 10/09/2019 1204   GLUCOSEU >=500 (A) 10/09/2019 1204   HGBUR NEGATIVE 10/09/2019 1204   BILIRUBINUR NEGATIVE 10/09/2019 1204   KETONESUR 5 (A) 10/09/2019 1204   PROTEINUR NEGATIVE 10/09/2019 1204   NITRITE NEGATIVE 10/09/2019 1204   LEUKOCYTESUR NEGATIVE 10/09/2019 1204   Sepsis Labs: @LABRCNTIP (procalcitonin:4,lacticidven:4) )No results found for this or any previous visit (from the past 240 hour(s)).   Radiological Exams on Admission: DG Chest Portable 1 View  Result Date: 10/09/2019 CLINICAL DATA:  Shortness breath EXAM: PORTABLE CHEST  1 VIEW COMPARISON:  Portable exam 1155 hours compared to 09/05/2019 FINDINGS: Enlargement of cardiac silhouette. Mediastinal contours and pulmonary vascularity normal. Improved airspace infiltrates, minimal residuals at RIGHT base and in LEFT upper lobe. No pleural effusion or pneumothorax. IMPRESSION: Significantly improved pulmonary infiltrates. Electronically Signed   By: Ulyses Southward M.D.   On: 10/09/2019 12:05     EKG: Independently reviewed.  Not done in ED, will get one.   Assessment/Plan Principal Problem:   Type 2 diabetes mellitus with hyperosmolar hyperglycemic state (HHS) (HCC) Active Problems:   Pneumonia due to COVID-19 virus   HTN (hypertension)   AKI (acute kidney injury) (HCC)   Type 2 diabetes mellitus with hyperosmolar hyperglycemic state (HHS) (HCC): Blood sugar 1460.  Anion gap normal.  No DKA.  Patient has history of diabetes since patient's A1c was 6.5 on 09/07/2019 which  meets criteria for diabetes diagnosis.  Patient is not taking diabetic medications currently.  Recent steroid use for COVID-19 infection may have made her diabetes worse.  -Admitted to stepdown as inpatient -Insulin drip -IV fluid: 3 L normal saline bolus, followed by 125 cc/h -Check CBG every hour -BMI of every 4 hours -consult diabetic educator  Recent pneumonia due to COVID-19 virus: Chest x-ray showed significant improvement with infiltration.  Patient still has mild cough and mild shortness of breath. -As needed bronchodilators, and prn Mucinex  HTN (hypertension): -Hold HCTZ since patient needs IV fluid -As needed hydralazine  AKI (acute kidney injury) (HCC): Most likely due to prerenal secondary to dehydration -IV fluid as above   Inpatient status:  # Patient requires inpatient status due to high intensity of service, high risk for further deterioration and high frequency of surveillance required.  I certify that at the point of admission it is my clinical judgment that the patient will require inpatient hospital care spanning beyond 2 midnights from the point of admission.  . This patient has multiple chronic comorbidities including hypertension, diabetes mellitus (A1c 6.5 on 09/07/2019), allergy, recent COVID-19 infection (09/05/19-09/12/19). . Now patient has presenting with type 2 diabetes mellitus with hyperosmolar hyperglycemic state with blood sugar 1460 and AKI. Marland Kitchen The worrisome physical exam findings include dry mucous and membrane . The initial radiographic and laboratory data are worrisome because of significantly elevated blood sugar 1460, also has AKI. Marland Kitchen Current medical needs: please see my assessment and plan . Predictability of an adverse outcome (risk): Patient has multiple comorbidities as listed above. Now presents with type 2 diabetes mellitus with hyperosmolar hyperglycemic state with blood sugar 1460 and AKI. Patient's presentation is highly complicated.  Patient  is at high risk of deteriorating.  Will need to be treated in hospital for at least 2 days.            DVT ppx: SQ Lovenox Code Status: Full code Family Communication: None at bed side.      Disposition Plan:  Anticipate discharge back to previous home environment Consults called:  none Admission status:   SDU/inpation       Date of Service 10/09/2019    Lorretta Harp [facility]   If 7PM-7AM, please contact night-coverage [email or web address] Password [facility] 10/09/2019, 6:08 PM

## 2019-10-09 NOTE — ED Notes (Signed)
Called ICU to give heads up. (Per Angie, RN report was given in lieu of)

## 2019-10-09 NOTE — ED Notes (Signed)
Pt changed into hospital bed, provided pillow and remote.

## 2019-10-09 NOTE — Progress Notes (Signed)
Inpatient Diabetes Program Recommendations  AACE/ADA: New Consensus Statement on Inpatient Glycemic Control (2015)  Target Ranges:  Prepandial:   less than 140 mg/dL      Peak postprandial:   less than 180 mg/dL (1-2 hours)      Critically ill patients:  140 - 180 mg/dL   Results for Ian Pennington, Ian Pennington (MRN [id number]) as of 10/09/2019 14:16  Ref. Range 10/09/2019 12:04  Glucose Latest Ref Range: 70 - 99 mg/dL 8,563 [facility])   Results for Ian Pennington, Ian Pennington (MRN [id number]) as of 10/09/2019 14:16  Ref. Range 09/07/2019 10:33  Hemoglobin A1C Latest Ref Range: 4.8 - 5.6 % 6.5 (H)    Admit with: Pt to ED via [facility] from home for high blood sugar, polyuria, and polydipsia--hospitalized with COVID in December--Was given steroids in the hospital and has been having high blood sugar since then   Current Orders: IV Insulin Drip (started at 1:43pm)      MD- Given glucose levels on admission, patient likely needs Insulin for home at least temporarily while recovering from COVID.  Looks like he supposedly finished up his Decadron at home 3 days after d/c.  Will need to keep cost considerations in mind given pt without insurance and no PCP listed.    Have placed [facility] consult for assistance with meds and finding affordable PCP.     Note patient discharged home on 09/11/2018 from hospital after admitted with COVID+ Pneumonia.  Sent home with Decadron taper.  No Diabetes meds prescribed.  A1c at that admission in December was 6.5%.  Pt required fairly large doses of both Lantus and Novolog during last hospitalization due to illness and Steroids.  Admission BMET today shows the following: Glucose 1460 mg/dl Anion Gap 14 CO2 level 24  Given IV NS bolus in the ED and IV Insulin Drip started using [employer] software at 1:43pm.     --Will follow patient during hospitalization--  Ambrose Finland RN, MSN, CDE Diabetes Coordinator Inpatient Glycemic Control Team Team Pager: [phone]  (8a-5p)

## 2019-10-09 NOTE — ED Provider Notes (Signed)
[facility] Emergency Department Provider Note  ____________________________________________   First MD Initiated Contact with Patient 10/09/19 1133     (approximate)  I have reviewed the triage vital signs and the nursing notes.   HISTORY  Chief Complaint Hyperglycemia    HPI Ian Pennington is a 54 y.o. male  Here with dry mouth, fatigue, polyuria. Pt was just hospitalized for COVID in Dec-January. He was given decadron and sent home on O2. He has been recovering well from a fever/SOB perspective, but endorses persistent and worsening extreme thirst, polyuria, and fatigue since discharge. He has been drinking "non-stop" and states it just runs through him. He has extremely dry mouth and more recently, SOB though he's unsure if this is because his mouth is so dry. Denies any worsening cough or sputum production. No abd pain, diarrhea. He has had some mild nausea and loss of appetite. Denies known h/o DM but did not regularly see MDs prior to his recent illnesses.        Past Medical History:  Diagnosis Date  . Allergy     Patient Active Problem List   Diagnosis Date Noted  . Acute respiratory failure with hypoxia ([place]) 09/05/2019  . Pneumonia due to COVID-19 virus 09/05/2019  . Abscess, periappendiceal 04/17/2015  . Acute appendicitis 04/12/2015    Past Surgical History:  Procedure Laterality Date  . APPENDECTOMY  04/12/2015   Procedure: APPENDECTOMY;  Surgeon: Sherri Rad, MD;  Location: [facility] ORS;  Service: General;;  laparoscopic appendectomy converted to open appendectomy    Prior to Admission medications   Medication Sig Start Date End Date Taking? Authorizing Provider  ascorbic acid (VITAMIN C) 500 MG tablet Take 1 tablet (500 mg total) by mouth daily. 09/13/19 10/13/19 Yes Sreenath, Sudheer B, MD  hydrochlorothiazide (HYDRODIURIL) 50 MG tablet Take 1 tablet (50 mg total) by mouth daily. 09/13/19 10/13/19 Yes Sreenath, Sudheer B, MD  Multiple Vitamin  (MULTIVITAMIN WITH MINERALS) TABS tablet Take 1 tablet by mouth daily. 09/13/19 10/13/19 Yes Sreenath, Sudheer B, MD  zinc sulfate 220 (50 Zn) MG capsule Take 1 capsule (220 mg total) by mouth daily. 09/13/19 10/13/19 Yes Sidney Ace, MD    Allergies Patient has no known allergies.  Family History  Problem Relation Age of Onset  . Diabetes Mother   . Hyperlipidemia Father     Social History Social History   Tobacco Use  . Smoking status: Former Smoker    Quit date: 04/11/2009    Years since quitting: 10.5  . Smokeless tobacco: Never Used  Substance Use Topics  . Alcohol use: No  . Drug use: No    Review of Systems  Review of Systems  Constitutional: Positive for fatigue. Negative for chills and fever.  HENT: Negative for sore throat.   Respiratory: Positive for shortness of breath.   Cardiovascular: Negative for chest pain.  Gastrointestinal: Positive for nausea. Negative for abdominal pain.  Endocrine: Positive for polydipsia, polyphagia and polyuria.  Genitourinary: Negative for flank pain.  Musculoskeletal: Negative for neck pain.  Skin: Negative for rash and wound.  Allergic/Immunologic: Negative for immunocompromised state.  Neurological: Positive for light-headedness. Negative for weakness and numbness.  Hematological: Does not bruise/bleed easily.  All other systems reviewed and are negative.    ____________________________________________  PHYSICAL EXAM:      VITAL SIGNS: ED Triage Vitals  Enc Vitals Group     BP      Pulse      Resp  Temp      Temp src      SpO2      Weight      Height      Head Circumference      Peak Flow      Pain Score      Pain Loc      Pain Edu?      Excl. in GC?      Physical Exam Vitals and nursing note reviewed.  Constitutional:      General: He is not in acute distress.    Appearance: He is well-developed.  HENT:     Head: Normocephalic and atraumatic.     Nose:     Comments: Markedly dry, tacky mucous  membranes    Mouth/Throat:     Mouth: Mucous membranes are dry.  Eyes:     Conjunctiva/sclera: Conjunctivae normal.  Cardiovascular:     Rate and Rhythm: Regular rhythm. Tachycardia present.     Heart sounds: Normal heart sounds.  Pulmonary:     Effort: Pulmonary effort is normal. Tachypnea present. No prolonged expiration or respiratory distress.     Breath sounds: No wheezing.  Abdominal:     General: Abdomen is flat. There is no distension.     Palpations: Abdomen is soft.  Musculoskeletal:     Cervical back: Neck supple.  Skin:    General: Skin is warm.     Capillary Refill: Capillary refill takes 2 to 3 seconds.     Findings: No rash.  Neurological:     Mental Status: He is alert and oriented to person, place, and time.     Motor: No abnormal muscle tone.       ____________________________________________   LABS (all labs ordered are listed, but only abnormal results are displayed)  Labs Reviewed  URINALYSIS, COMPLETE (UACMP) WITH MICROSCOPIC - Abnormal; Notable for the following components:      Result Value   Color, Urine STRAW (*)    APPearance CLEAR (*)    Glucose, UA >=500 (*)    Ketones, ur 5 (*)    All other components within normal limits  COMPREHENSIVE METABOLIC PANEL - Abnormal; Notable for the following components:   Sodium 134 (*)    Chloride 96 (*)    BUN 31 (*)    Creatinine, Ser 1.34 (*)    Calcium 8.8 (*)    Total Protein 5.7 (*)    Albumin 3.1 (*)    AST 55 (*)    ALT 102 (*)    Total Bilirubin 1.4 (*)    GFR calc non Af Amer 59 (*)    All other components within normal limits  GLUCOSE, CAPILLARY - Abnormal; Notable for the following components:   Glucose-Capillary >600 (*)    All other components within normal limits  GLUCOSE, CAPILLARY - Abnormal; Notable for the following components:   Glucose-Capillary >600 (*)    All other components within normal limits  CBC  BLOOD GAS, VENOUS  OSMOLALITY  CBG MONITORING, ED  CBG MONITORING,  ED  TROPONIN I (HIGH SENSITIVITY)  TROPONIN I (HIGH SENSITIVITY)    ____________________________________________  EKG: Sinus tachycardia, ventricular rate 117.  PR 131, QRS 108, QTc 431.  RSR prime in V1.  Diffuse ST depressions noted in inferolateral leads.  ST flattening noted in lead III.  No ST elevations noted.  Findings concerning for diffuse, likely demand ischemia. ________________________________________  RADIOLOGY All imaging, including plain films, CT scans, and ultrasounds, independently reviewed by me,  and interpretations confirmed via formal radiology reads.  ED MD interpretation:   Chest x-ray: Pulmonary infiltrates markedly improved from prior  Official radiology report(s): DG Chest Portable 1 View  Result Date: 10/09/2019 CLINICAL DATA:  Shortness breath EXAM: PORTABLE CHEST 1 VIEW COMPARISON:  Portable exam 1155 hours compared to 09/05/2019 FINDINGS: Enlargement of cardiac silhouette. Mediastinal contours and pulmonary vascularity normal. Improved airspace infiltrates, minimal residuals at RIGHT base and in LEFT upper lobe. No pleural effusion or pneumothorax. IMPRESSION: Significantly improved pulmonary infiltrates. Electronically Signed   By: Ulyses Southward M.D.   On: 10/09/2019 12:05    ____________________________________________  PROCEDURES   Procedure(s) performed (including Critical Care):  .Critical Care Performed by: Shaune Pollack, MD Authorized by: Shaune Pollack, MD   Critical care provider statement:    Critical care time (minutes):  35   Critical care time was exclusive of:  Separately billable procedures and treating other patients and teaching time   Critical care was necessary to treat or prevent imminent or life-threatening deterioration of the following conditions:  Renal failure, circulatory failure, cardiac failure, respiratory failure and metabolic crisis   Critical care was time spent personally by me on the following activities:   Development of treatment plan with patient or surrogate, discussions with consultants, evaluation of patient's response to treatment, examination of patient, obtaining history from patient or surrogate, ordering and performing treatments and interventions, ordering and review of laboratory studies, ordering and review of radiographic studies, pulse oximetry, re-evaluation of patient's condition and review of old charts   I assumed direction of critical care for this patient from another provider in my specialty: no      ____________________________________________  INITIAL IMPRESSION / MDM / ASSESSMENT AND PLAN / ED COURSE  As part of my medical decision making, I reviewed the following data within the electronic MEDICAL RECORD NUMBER Nursing notes reviewed and incorporated, Old chart reviewed, Notes from prior ED visits, and [place] Controlled Substance Database       *Ian Pennington was evaluated in Emergency Department on 10/09/2019 for the symptoms described in the history of present illness. He was evaluated in the context of the global COVID-19 pandemic, which necessitated consideration that the patient might be at risk for infection with the SARS-CoV-2 virus that causes COVID-19. Institutional protocols and algorithms that pertain to the evaluation of patients at risk for COVID-19 are in a state of rapid change based on information released by regulatory bodies including the CDC and federal and state organizations. These policies and algorithms were followed during the patient's care in the ED.  Some ED evaluations and interventions may be delayed as a result of limited staffing during the pandemic.*  Clinical Course as of Oct 08 1326  Fri Oct 09, 2019  1157 54 yo M here with suspected new-onset DM precipitated by recent illness, steroids now with possible DKA. Pt markedly dehydrated, tachycardic on arrival but mentating well. Labs pending, IVF started. Denies h/o Dm but given habitus, age, suspect  possible underlying metabolic d/o exacerbated by steroids/illness.   [CI]  1242 VBG without signs of acidosis and UA with 5 ketones only likely 2/2 dehydration and polyuria.   [CI]  1327 Glu >1400. Normal AG of 14, however. Mild AKI noted. IVF boluses x 2, will start on insulin gtt. IV K replacement ordered as well. Pt awake, alert, and in agreement.   [CI]    Clinical Course User Index [CI] Shaune Pollack, MD    Medical Decision Making:  As above.  HHS/hyperglycemic crisis. IVF, insulin. No apparent infectious triggers. Likely 2/2 recent COVID-19 and steroid use with underlying pre-diabetes vs early T2DM.  ____________________________________________  FINAL CLINICAL IMPRESSION(S) / ED DIAGNOSES  Final diagnoses:  Hyperglycemic crisis in diabetes mellitus (HCC)     MEDICATIONS GIVEN DURING THIS VISIT:  Medications  insulin regular, human (MYXREDLIN) 100 units/ 100 mL infusion (has no administration in time range)  0.9 %  sodium chloride infusion (has no administration in time range)  dextrose 5 %-0.45 % sodium chloride infusion (has no administration in time range)  dextrose 50 % solution 0-50 mL (has no administration in time range)  potassium chloride 10 mEq in 100 mL IVPB (has no administration in time range)  sodium chloride 0.9 % bolus 2,000 mL (2,000 mLs Intravenous New Bag/Given 10/09/19 1229)     ED Discharge Orders    None       Note:  This document was prepared using Dragon voice recognition software and may include unintentional dictation errors.   Shaune Pollack, MD 10/09/19 1329

## 2019-10-09 NOTE — ED Triage Notes (Signed)
Pt to ED via [facility] from home for high blood sugar, polyuria, and polydipsia. Pt was hospitalized with COVID in December. Was given steroids in the hospital and has been having high blood sugar since then. Pt also reports blurred vision x 3 weeks and N/V x 1 week. Pt demanding something to drink upon arrival, stating that his mouth is dry and that he is "going to die". Pt informed that he cannot have anything to drink until he sees MD and reassured that he is in a safe place. Pt gagging on assessment put no vomiting noted. Pt is Tachycardiac at 116

## 2019-10-10 DIAGNOSIS — E1165 Type 2 diabetes mellitus with hyperglycemia: Secondary | ICD-10-CM

## 2019-10-10 DIAGNOSIS — I10 Essential (primary) hypertension: Secondary | ICD-10-CM

## 2019-10-10 DIAGNOSIS — E1100 Type 2 diabetes mellitus with hyperosmolarity without nonketotic hyperglycemic-hyperosmolar coma (NKHHC): Principal | ICD-10-CM

## 2019-10-10 LAB — GLUCOSE, CAPILLARY
Glucose-Capillary: 126 mg/dL — ABNORMAL HIGH (ref 70–99)
Glucose-Capillary: 132 mg/dL — ABNORMAL HIGH (ref 70–99)
Glucose-Capillary: 170 mg/dL — ABNORMAL HIGH (ref 70–99)
Glucose-Capillary: 195 mg/dL — ABNORMAL HIGH (ref 70–99)
Glucose-Capillary: 208 mg/dL — ABNORMAL HIGH (ref 70–99)
Glucose-Capillary: 224 mg/dL — ABNORMAL HIGH (ref 70–99)
Glucose-Capillary: 224 mg/dL — ABNORMAL HIGH (ref 70–99)
Glucose-Capillary: 230 mg/dL — ABNORMAL HIGH (ref 70–99)
Glucose-Capillary: 257 mg/dL — ABNORMAL HIGH (ref 70–99)
Glucose-Capillary: 257 mg/dL — ABNORMAL HIGH (ref 70–99)
Glucose-Capillary: 263 mg/dL — ABNORMAL HIGH (ref 70–99)
Glucose-Capillary: 306 mg/dL — ABNORMAL HIGH (ref 70–99)
Glucose-Capillary: 321 mg/dL — ABNORMAL HIGH (ref 70–99)
Glucose-Capillary: 323 mg/dL — ABNORMAL HIGH (ref 70–99)
Glucose-Capillary: 350 mg/dL — ABNORMAL HIGH (ref 70–99)
Glucose-Capillary: 357 mg/dL — ABNORMAL HIGH (ref 70–99)
Glucose-Capillary: 401 mg/dL — ABNORMAL HIGH (ref 70–99)
Glucose-Capillary: 407 mg/dL — ABNORMAL HIGH (ref 70–99)

## 2019-10-10 LAB — BASIC METABOLIC PANEL
Anion gap: 9 (ref 5–15)
Anion gap: 9 (ref 5–15)
Anion gap: 10 (ref 5–15)
BUN: 19 mg/dL (ref 6–20)
BUN: 20 mg/dL (ref 6–20)
BUN: 20 mg/dL (ref 6–20)
CO2: 27 mmol/L (ref 22–32)
CO2: 28 mmol/L (ref 22–32)
CO2: 23 mmol/L (ref 22–32)
Calcium: 8.6 mg/dL — ABNORMAL LOW (ref 8.9–10.3)
Calcium: 8.6 mg/dL — ABNORMAL LOW (ref 8.9–10.3)
Calcium: 8.3 mg/dL — ABNORMAL LOW (ref 8.9–10.3)
Chloride: 112 mmol/L — ABNORMAL HIGH (ref 98–111)
Chloride: 110 mmol/L (ref 98–111)
Chloride: 107 mmol/L (ref 98–111)
Creatinine, Ser: 0.84 mg/dL (ref 0.61–1.24)
Creatinine, Ser: 0.88 mg/dL (ref 0.61–1.24)
Creatinine, Ser: 0.9 mg/dL (ref 0.61–1.24)
GFR calc Af Amer: >60 mL/min (ref >60)
GFR calc Af Amer: >60 mL/min (ref >60)
GFR calc Af Amer: >60 mL/min (ref >60)
GFR calc non Af Amer: >60 mL/min (ref >60)
GFR calc non Af Amer: >60 mL/min (ref >60)
GFR calc non Af Amer: >60 mL/min (ref >60)
Glucose, Bld: 135 mg/dL — ABNORMAL HIGH (ref 70–99)
Glucose, Bld: 248 mg/dL — ABNORMAL HIGH (ref 70–99)
Glucose, Bld: 319 mg/dL — ABNORMAL HIGH (ref 70–99)
Potassium: 3 mmol/L — ABNORMAL LOW (ref 3.5–5.1)
Potassium: 4.5 mmol/L (ref 3.5–5.1)
Potassium: 3.7 mmol/L (ref 3.5–5.1)
Sodium: 148 mmol/L — ABNORMAL HIGH (ref 135–145)
Sodium: 147 mmol/L — ABNORMAL HIGH (ref 135–145)
Sodium: 140 mmol/L (ref 135–145)

## 2019-10-10 LAB — MAGNESIUM: Magnesium: 2 mg/dL (ref 1.7–2.4)

## 2019-10-10 LAB — HIV ANTIBODY (ROUTINE TESTING W REFLEX): HIV Screen 4th Generation wRfx: NONREACTIVE

## 2019-10-10 LAB — PHOSPHORUS: Phosphorus: 2.3 mg/dL — ABNORMAL LOW (ref 2.5–4.6)

## 2019-10-10 LAB — LIPASE, BLOOD: Lipase: 75 U/L — ABNORMAL HIGH (ref 11–51)

## 2019-10-10 MED ORDER — INSULIN GLARGINE 100 UNIT/ML ~~LOC~~ SOLN
10.0000 [IU] | Freq: Every day | SUBCUTANEOUS | Status: DC
  Filled 2019-10-10: qty 0.1

## 2019-10-10 MED ORDER — INSULIN ASPART 100 UNIT/ML ~~LOC~~ SOLN
0.0000 [IU] | SUBCUTANEOUS | Status: DC
  Administered 2019-10-10: 4 [IU] via SUBCUTANEOUS

## 2019-10-10 MED ORDER — INSULIN ASPART 100 UNIT/ML ~~LOC~~ SOLN
0.0000 [IU] | Freq: Three times a day (TID) | SUBCUTANEOUS | Status: DC
  Administered 2019-10-10: 7 [IU] via SUBCUTANEOUS
  Administered 2019-10-10: 11 [IU] via SUBCUTANEOUS
  Filled 2019-10-10 (×2): qty 1

## 2019-10-10 MED ORDER — DEXTROSE-NACL 5-0.2 % IV SOLN: INTRAVENOUS | Status: DC

## 2019-10-10 MED ORDER — INSULIN ASPART 100 UNIT/ML ~~LOC~~ SOLN
0.0000 [IU] | Freq: Three times a day (TID) | SUBCUTANEOUS | Status: DC
  Administered 2019-10-11: 7 [IU] via SUBCUTANEOUS
  Administered 2019-10-11: 4 [IU] via SUBCUTANEOUS
  Administered 2019-10-11 (×2): 20 [IU] via SUBCUTANEOUS
  Administered 2019-10-12: 4 [IU] via SUBCUTANEOUS
  Administered 2019-10-12: 7 [IU] via SUBCUTANEOUS
  Administered 2019-10-12: 4 [IU] via SUBCUTANEOUS
  Administered 2019-10-12: 15 [IU] via SUBCUTANEOUS
  Administered 2019-10-13 (×2): 7 [IU] via SUBCUTANEOUS
  Filled 2019-10-10 (×10): qty 1

## 2019-10-10 MED ORDER — LACTATED RINGERS IV SOLN: INTRAVENOUS | Status: DC

## 2019-10-10 MED ORDER — INSULIN ASPART 100 UNIT/ML ~~LOC~~ SOLN: 0.0000 [IU] | Freq: Three times a day (TID) | SUBCUTANEOUS | Status: DC

## 2019-10-10 MED ORDER — TRAMADOL HCL 50 MG PO TABS
50.0000 mg | ORAL_TABLET | Freq: Four times a day (QID) | ORAL | Status: DC | PRN
  Administered 2019-10-10 – 2019-10-12 (×5): 50 mg via ORAL
  Filled 2019-10-10 (×5): qty 1

## 2019-10-10 MED ORDER — INSULIN GLARGINE 100 UNIT/ML ~~LOC~~ SOLN
15.0000 [IU] | Freq: Every day | SUBCUTANEOUS | Status: DC
  Administered 2019-10-10: 15 [IU] via SUBCUTANEOUS
  Filled 2019-10-10: qty 0.15

## 2019-10-10 MED ORDER — INSULIN ASPART 100 UNIT/ML ~~LOC~~ SOLN
0.0000 [IU] | SUBCUTANEOUS | Status: DC
  Administered 2019-10-10: 5 [IU] via SUBCUTANEOUS
  Filled 2019-10-10: qty 1

## 2019-10-10 MED ORDER — KETOROLAC TROMETHAMINE 30 MG/ML IJ SOLN
30.0000 mg | Freq: Four times a day (QID) | INTRAMUSCULAR | Status: DC | PRN
  Administered 2019-10-11 – 2019-10-13 (×6): 30 mg via INTRAVENOUS
  Filled 2019-10-10 (×6): qty 1

## 2019-10-10 MED ORDER — INSULIN ASPART 100 UNIT/ML ~~LOC~~ SOLN
0.0000 [IU] | SUBCUTANEOUS | Status: DC
  Administered 2019-10-10: 8 [IU] via SUBCUTANEOUS
  Filled 2019-10-10 (×2): qty 1

## 2019-10-10 MED ORDER — INSULIN STARTER KIT- PEN NEEDLES (ENGLISH)
1.0000 | Freq: Once | Status: DC
  Filled 2019-10-10: qty 1

## 2019-10-10 MED ORDER — CHLORHEXIDINE GLUCONATE CLOTH 2 % EX PADS
6.0000 | MEDICATED_PAD | Freq: Every day | CUTANEOUS | Status: DC
  Administered 2019-10-10 – 2019-10-13 (×4): 6 via TOPICAL

## 2019-10-10 MED ORDER — HYDROCHLOROTHIAZIDE 50 MG PO TABS
50.0000 mg | ORAL_TABLET | Freq: Every day | ORAL | Status: DC
  Administered 2019-10-11 – 2019-10-13 (×3): 50 mg via ORAL
  Filled 2019-10-10: qty 1
  Filled 2019-10-10 (×2): qty 2
  Filled 2019-10-10 (×2): qty 1
  Filled 2019-10-10: qty 2

## 2019-10-10 MED ORDER — LIVING WELL WITH DIABETES BOOK
Freq: Once | Status: AC
  Filled 2019-10-10: qty 1

## 2019-10-10 MED ORDER — INSULIN ASPART 100 UNIT/ML ~~LOC~~ SOLN
10.0000 [IU] | Freq: Three times a day (TID) | SUBCUTANEOUS | Status: DC
  Administered 2019-10-10: 10 [IU] via SUBCUTANEOUS
  Filled 2019-10-10: qty 1

## 2019-10-10 MED ORDER — GABAPENTIN 300 MG PO CAPS
300.0000 mg | ORAL_CAPSULE | Freq: Two times a day (BID) | ORAL | Status: DC
  Administered 2019-10-10 – 2019-10-13 (×7): 300 mg via ORAL
  Filled 2019-10-10 (×7): qty 1

## 2019-10-10 MED ORDER — INSULIN GLARGINE 100 UNIT/ML ~~LOC~~ SOLN
25.0000 [IU] | Freq: Every day | SUBCUTANEOUS | Status: DC
  Filled 2019-10-10: qty 0.25

## 2019-10-10 MED ORDER — POTASSIUM CHLORIDE CRYS ER 20 MEQ PO TBCR
40.0000 meq | EXTENDED_RELEASE_TABLET | ORAL | Status: AC
  Administered 2019-10-10 (×2): 40 meq via ORAL
  Filled 2019-10-10 (×2): qty 2

## 2019-10-10 MED ORDER — METFORMIN HCL 500 MG PO TABS
500.0000 mg | ORAL_TABLET | Freq: Two times a day (BID) | ORAL | Status: DC
  Administered 2019-10-10 – 2019-10-12 (×6): 500 mg via ORAL
  Filled 2019-10-10 (×7): qty 1

## 2019-10-10 MED ORDER — DICLOFENAC SODIUM 1 % EX GEL
2.0000 g | Freq: Four times a day (QID) | CUTANEOUS | Status: DC
  Administered 2019-10-10 – 2019-10-13 (×11): 2 g via TOPICAL
  Filled 2019-10-10: qty 100

## 2019-10-10 MED ORDER — INSULIN ASPART 100 UNIT/ML ~~LOC~~ SOLN
20.0000 [IU] | Freq: Three times a day (TID) | SUBCUTANEOUS | Status: DC
  Administered 2019-10-11 (×3): 20 [IU] via SUBCUTANEOUS
  Filled 2019-10-10 (×3): qty 1

## 2019-10-10 MED ORDER — INSULIN ASPART 100 UNIT/ML ~~LOC~~ SOLN
10.0000 [IU] | Freq: Once | SUBCUTANEOUS | Status: AC
  Administered 2019-10-10: 10 [IU] via SUBCUTANEOUS
  Filled 2019-10-10: qty 1

## 2019-10-10 NOTE — Progress Notes (Signed)
Patient transitioned of the endo tool to sliding scale with carb mod diet at this time. Oral Potassium replacement. D5 1/2NS maintained at 75cc/hr per orders. No basal insulin ordered at this time. Patient educated on new diet, in addition, hospitalist ordered diabetic education consult.

## 2019-10-10 NOTE — Plan of Care (Signed)
  Problem: Education: Goal: Ability to describe self-care measures that may prevent or decrease complications (Diabetes Survival Skills Education) will improve Outcome: Progressing   Problem: Respiratory: Goal: Will regain and/or maintain adequate ventilation Outcome: Progressing   Problem: Urinary Elimination: Goal: Ability to achieve and maintain adequate renal perfusion and functioning will improve Outcome: Progressing

## 2019-10-10 NOTE — Progress Notes (Addendum)
PROGRESS NOTE    MARKEESE BOYAJIAN  QMV:[id number] DOB: October 06, 1965 DOA: 10/09/2019  PCP: Patient, No Pcp Per    LOS - 1   Brief Narrative:  TRYSTEN BERNARD is a 54 y.o. male with medical history significant of hypertension, diabetes mellitus (A1c 6.5 on 09/07/2019), allergy, recent COVID-19 infection (12/26-1/2), who presents with progressive polyuria, polydipsia, blurry vision and generalized weakness x 3 weeks.  He was admitted from 09/05/19 - 09/12/2019 due to COVID-19 infection, completed remdesivir and steroids.  No prior diagnosis of diabetes but did require signficant insulin for glucose control, was not discharged on any insulin or meds, close PCP follow up was recommended.  In the ED, found to have blood glucose of 1460 with normal anion gap, consistent with HHS. Patient was started on insulin drip and admitted to stepdown unit for further management.  Subjective 1/30: Patient seen this AM.  He complains of severe right leg pain from the knee to mid-calf.  Says his knee is bad and often painful.  Has low back trouble and did have left-sided sciatica or radicular symptoms before, but says this feels different.  Vision still blurry at distance but okay up close.  He has no other complaints.  Discussed importance of control of his diabetes to prevent the many possible complications. Still has blurry vision.  Can see up close and could make out how many fingers I held up but said TV blurry.  Assessment & Plan:   Principal Problem:   Type 2 diabetes mellitus with hyperosmolar hyperglycemic state (HHS) ([place]) Active Problems:   Pneumonia due to COVID-19 virus   HTN (hypertension)   AKI (acute kidney injury) ([place])   Type 2 diabetes mellitus with hyperosmolar hyperglycemic state (HHS) ([place]):  Presented with BG 1460, normal anion gap with symptoms of Polyuria, polydipsia, weakness, blurry vision.  A1C 6.5% on 09/07/19, but not on medications outpatient yet. Prior admission needed insulin while  on steroids.  Off insulin drip. --titrate insulin up as needed --Lantus 25 units daily --Novolog 20 units TID WC + resistant sliding scale --started metformin 500 mg BID --once appropriate insulin regimen is determined and patient trained/educated on administering it, can discharge with close PCP followup  Blurry Vision - secondary to uncontrolled blood glucose.  Discussed with ophthalmologist) likely this is lens swelling which will resolve over a couple of weeks OR macular edema which is best evaluated outpatient with OCT.  Recommended outpatient followup.  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  Having radiation down to calf.  No trauma or injury. --monitor & consider imaging if not improving or getting worse --pain control w Voltaren gel and PRN tramadol  Recent pneumonia due to COVID-19 virus: Chest x-ray showed significant improvement of prior infiltrates.  Patient still has mild cough and mild shortness of breath. -As needed bronchodilators, and prn Mucinex  Hypertension -resume HCTZ -As needed hydralazine  AKI (acute kidney injury): Most likely due to prerenal secondary to dehydration with HHS --improved with IV hydration --monitor BMP    DVT prophylaxis: Lovenox   Code Status: Full Code  Family Communication: none at bedside  Disposition Plan:  Expect d/c home pending improvement.  Estimate 1-2 more days.   Consultants:   Dietician  Diabetic Coordinator  Procedures:   None  Antimicrobials:   None    Objective: Vitals:   10/10/19 1100 10/10/19 1200 10/10/19 1300 10/10/19 1400  BP: 115/69 122/65  132/73  Pulse: 84     Resp: (!) 21 (!)  21 18 20   Temp:    97.9 F (36.6 C)  TempSrc:    Oral  SpO2: 90%     Weight:      Height:        Intake/Output Summary (Last 24 hours) at 10/10/2019 1712 Last data filed at 10/10/2019 1408 Gross per 24 hour  Intake 3749.94 ml  Output 1550 ml  Net 2199.94 ml   Filed Weights   10/09/19 1140 10/09/19  1842  Weight: 134.3 kg 126.6 kg    Examination:  General exam: awake, alert, no acute distress, obese HEENT: moist mucus membranes, hearing grossly normal, can identify number fingers from a few feet away  Respiratory system: clear to auscultation bilaterally, no wheezes, rales or rhonchi, normal respiratory effort. Cardiovascular system: normal S1/S2, RRR, no JVD, murmurs, rubs, gallops, no pedal edema.   Gastrointestinal system: soft, non-tender, non-distended abdomen, no organomegaly or masses felt, normal bowel sounds. Central nervous system: alert and oriented x4. no gross focal neurologic deficits, normal speech, negative straight leg raise on right, tenderness on palpation of right knee Extremities: moves all, no cyanosis, normal tone Skin: dry, intact, normal temperature Psychiatry: normal mood, congruent affect, judgement and insight appear normal    Data Reviewed: I have personally reviewed following labs and imaging studies  CBC: Recent Labs  Lab 10/09/19 1204  WBC 7.3  HGB 14.4  HCT 45.7  MCV 95.8  PLT 832   Basic Metabolic Panel: Recent Labs  Lab 10/09/19 1908 10/09/19 2316 10/10/19 0254 10/10/19 0621 10/10/19 1031  NA 141 144 148* 147* 140  K 4.1 3.0* 3.0* 4.5 3.7  CL 102 110 112* 110 107  CO2 28 26 27 28 23   GLUCOSE 635* 291* 135* 248* 319*  BUN 24* 19 19 20 20   CREATININE 1.08 0.97 0.84 0.88 0.90  CALCIUM 9.4 8.7* 8.6* 8.6* 8.3*  MG  --   --   --  2.0  --   PHOS  --   --   --  2.3*  --    GFR: Estimated Creatinine Clearance: 134.7 mL/min (by C-G formula based on SCr of 0.9 mg/dL). Liver Function Tests: Recent Labs  Lab 10/09/19 1204  AST 55*  ALT 102*  ALKPHOS 61  BILITOT 1.4*  PROT 5.7*  ALBUMIN 3.1*   Recent Labs  Lab 10/10/19 0621  LIPASE 75*   No results for input(s): AMMONIA in the last 168 hours. Coagulation Profile: No results for input(s): INR, PROTIME in the last 168 hours. Cardiac Enzymes: No results for input(s):  CKTOTAL, CKMB, CKMBINDEX, TROPONINI in the last 168 hours. BNP (last 3 results) No results for input(s): PROBNP in the last 8760 hours. HbA1C: No results for input(s): HGBA1C in the last 72 hours. CBG: Recent Labs  Lab 10/10/19 1054 10/10/19 1231 10/10/19 1330 10/10/19 1406 10/10/19 1643  GLUCAP 321* 257* 407* 401* 208*   Lipid Profile: No results for input(s): CHOL, HDL, LDLCALC, TRIG, CHOLHDL, LDLDIRECT in the last 72 hours. Thyroid Function Tests: No results for input(s): TSH, T4TOTAL, FREET4, T3FREE, THYROIDAB in the last 72 hours. Anemia Panel: No results for input(s): VITAMINB12, FOLATE, FERRITIN, TIBC, IRON, RETICCTPCT in the last 72 hours. Sepsis Labs: No results for input(s): PROCALCITON, LATICACIDVEN in the last 168 hours.  Recent Results (from the past 240 hour(s))  MRSA PCR Screening     Status: None   Collection Time: 10/09/19  6:46 PM   Specimen: Nasopharyngeal  Result Value Ref Range Status   MRSA by PCR NEGATIVE  NEGATIVE Final    Comment:        The GeneXpert MRSA Assay (FDA approved for NASAL specimens only), is one component of a comprehensive MRSA colonization surveillance program. It is not intended to diagnose MRSA infection nor to guide or monitor treatment for MRSA infections. Performed at [facility], [address], [place], [place] [address]          Radiology Studies: DG Chest Portable 1 View  Result Date: 10/09/2019 CLINICAL DATA:  Shortness breath EXAM: PORTABLE CHEST 1 VIEW COMPARISON:  Portable exam 1155 hours compared to 09/05/2019 FINDINGS: Enlargement of cardiac silhouette. Mediastinal contours and pulmonary vascularity normal. Improved airspace infiltrates, minimal residuals at RIGHT base and in LEFT upper lobe. No pleural effusion or pneumothorax. IMPRESSION: Significantly improved pulmonary infiltrates. Electronically Signed   By: Lavonia Dana M.D.   On: 10/09/2019 12:05        Scheduled Meds: . ascorbic acid   500 mg Oral Daily  . Chlorhexidine Gluconate Cloth  6 each Topical Daily  . dextromethorphan-guaiFENesin  1 tablet Oral BID  . enoxaparin (LOVENOX) injection  40 mg Subcutaneous Q24H  . gabapentin  300 mg Oral BID  . insulin aspart  0-20 Units Subcutaneous TID WC  . insulin aspart  20 Units Subcutaneous TID WC  . [START ON 10/11/2019] insulin glargine  25 Units Subcutaneous Daily  . insulin starter kit- pen needles  1 kit Other Once  . metFORMIN  500 mg Oral BID WC  . multivitamin with minerals  1 tablet Oral Daily  . zinc sulfate  220 mg Oral Daily   Continuous Infusions:   LOS: 1 day    Time spent: 40-45 minutes    Ezekiel Slocumb, DO [facility]   If 7PM-7AM, please contact night-coverage [email or web address] 10/10/2019, 5:12 PM

## 2019-10-10 NOTE — Progress Notes (Signed)
Report called 2A Writer

## 2019-10-10 NOTE — Progress Notes (Addendum)
Pt was admitted on the floro with nos signs of distress. Pt was alert and oriented x4. VSS. Pt ascom was within pt reach and was educated about safety. Pt blood sugar at 92 snacks given. Will continue to monitor.  Update 2259: Pt blood sugar 195 after snacks.

## 2019-10-10 NOTE — Progress Notes (Signed)
Inpatient Diabetes Program Recommendations  AACE/ADA: New Consensus Statement on Inpatient Glycemic Control   Target Ranges:  Prepandial:   less than 140 mg/dL      Peak postprandial:   less than 180 mg/dL (1-2 hours)      Critically ill patients:  140 - 180 mg/dL   Results for Ian Pennington, Ian Pennington (MRN [id number]) as of 10/10/2019 10:354  Ref. Range 10/10/2019 00:21 10/10/2019 01:23 10/10/2019 02:44 10/10/2019 04:49 10/10/2019 05:52 10/10/2019 06:15 10/10/2019 07:18 10/10/2019 08:01 10/10/2019 08:56 10/10/2019 10:00  Glucose-Capillary Latest Ref Range: 70 - 99 mg/dL 355 (H) 732 (H) 202 (H)  Novolog 4 units 224 (H)  Novolog 5 units 263 (H)   230 (H) 224 (H) 257 (H) 357 (H)  Novolog 8 units 306 (H)  Results for [name], [name] (MRN [id number]) as of 10/10/2019 10:35  Ref. Range 10/09/2019 12:04  Glucose Latest Ref Range: 70 - 99 mg/dL 6,283 [facility])  Results for [name], [name] (MRN [id number]) as of 10/10/2019 10:35  Ref. Range 09/07/2019 10:33  Hemoglobin A1C Latest Ref Range: 4.8 - 5.6 % 6.5 (H)   Review of Glycemic Control  Admit with: Pt to ED via [facility] from home for high blood sugar, polyuria, and polydipsia--hospitalized with COVID in December--Was given steroids in the hospital and was discharged 1/2/21on Decadron 6 mg Q6H for 3 days and has been having high blood sugar since then  Current Orders: Lantus 15 units daily, Novolog 0-15 units TID with meals, Metformin 500 mg BID  Inpatient Diabetes Recommendations:  Insulin-Basal: Patient was transitioned off IV insulin during the night and was not given any basal insulin. As a result of not getting any basal insulin glucose has been consistently elevated since being transitioned to SQ insulin (Novolog correction only). Patient ordered Lantus 15 units daily today to start at 11:00 am today.  NOTE:  Patient inpatient 09/04/20-09/12/19 and was discharged home on 09/12/2019 from hospital after admission with COVID+ Pneumonia.  Sent home with Decadron  taper of 6 mg Q6H for 3 days.  No Diabetes meds prescribed.  A1c at that admission in December was 6.5%. Given glucose levels on admission, patient likely needs Insulin for home at least temporarily while recovering from COVID. Will need to keep cost considerations in mind given patient without insurance and no PCP listed.  TOC consult for assistance with meds and finding affordable PCP.  Addendum 10/10/19-Called patient over the phone to discuss new DM dx. Introduced self and asked if we could discuss DM and glycemic control. Patient stated "Now isn't a good time. I am in a lot of pain in my knees and they aren't giving me anything for pain." Informed patient that diabetes coordinator will call back tomorrow to discuss new DM dx.  Thanks, [name], RN, MSN, CDE Diabetes Coordinator Inpatient Diabetes Program [phone] (Team Pager from 8am to 5pm)

## 2019-10-11 LAB — CBC
HCT: 37.7 % — ABNORMAL LOW (ref 39.0–52.0)
Hemoglobin: 12.7 g/dL — ABNORMAL LOW (ref 13.0–17.0)
MCH: 30.5 pg (ref 26.0–34.0)
MCHC: 33.7 g/dL (ref 30.0–36.0)
MCV: 90.6 fL (ref 80.0–100.0)
Platelets: 225 10*3/uL (ref 150–400)
RBC: 4.16 MIL/uL — ABNORMAL LOW (ref 4.22–5.81)
RDW: 13 % (ref 11.5–15.5)
WBC: 5.3 10*3/uL (ref 4.0–10.5)
nRBC: 0 % (ref 0.0–0.2)

## 2019-10-11 LAB — BASIC METABOLIC PANEL
Anion gap: 8 (ref 5–15)
BUN: 16 mg/dL (ref 6–20)
CO2: 23 mmol/L (ref 22–32)
Calcium: 8.2 mg/dL — ABNORMAL LOW (ref 8.9–10.3)
Chloride: 107 mmol/L (ref 98–111)
Creatinine, Ser: 0.87 mg/dL (ref 0.61–1.24)
GFR calc Af Amer: >60 mL/min (ref >60)
GFR calc non Af Amer: >60 mL/min (ref >60)
Glucose, Bld: 368 mg/dL — ABNORMAL HIGH (ref 70–99)
Potassium: 4.4 mmol/L (ref 3.5–5.1)
Sodium: 138 mmol/L (ref 135–145)

## 2019-10-11 LAB — GLUCOSE, CAPILLARY
Glucose-Capillary: 365 mg/dL — ABNORMAL HIGH (ref 70–99)
Glucose-Capillary: 386 mg/dL — ABNORMAL HIGH (ref 70–99)
Glucose-Capillary: 165 mg/dL — ABNORMAL HIGH (ref 70–99)
Glucose-Capillary: 245 mg/dL — ABNORMAL HIGH (ref 70–99)

## 2019-10-11 LAB — MAGNESIUM: Magnesium: 2 mg/dL (ref 1.7–2.4)

## 2019-10-11 MED ORDER — INSULIN GLARGINE 100 UNIT/ML ~~LOC~~ SOLN: 30.0000 [IU] | Freq: Two times a day (BID) | SUBCUTANEOUS | Status: DC

## 2019-10-11 MED ORDER — INSULIN GLARGINE 100 UNIT/ML ~~LOC~~ SOLN
20.0000 [IU] | Freq: Two times a day (BID) | SUBCUTANEOUS | Status: DC
  Administered 2019-10-11: 20 [IU] via SUBCUTANEOUS
  Filled 2019-10-11 (×3): qty 0.2

## 2019-10-11 MED ORDER — INSULIN GLARGINE 100 UNIT/ML ~~LOC~~ SOLN
40.0000 [IU] | Freq: Two times a day (BID) | SUBCUTANEOUS | Status: DC
  Administered 2019-10-11: 40 [IU] via SUBCUTANEOUS
  Filled 2019-10-11 (×2): qty 0.4

## 2019-10-11 MED ORDER — NEPRO/CARBSTEADY PO LIQD
237.0000 mL | Freq: Three times a day (TID) | ORAL | Status: DC
  Administered 2019-10-11 – 2019-10-13 (×6): 237 mL via ORAL

## 2019-10-11 MED ORDER — FUROSEMIDE 10 MG/ML IJ SOLN
40.0000 mg | Freq: Once | INTRAMUSCULAR | Status: AC
  Administered 2019-10-11: 40 mg via INTRAVENOUS
  Filled 2019-10-11: qty 4

## 2019-10-11 NOTE — Progress Notes (Signed)
Initial Nutrition Assessment  DOCUMENTATION CODES:   Obesity unspecified  INTERVENTION:   Nepro Shake po TID, each supplement provides 425 kcal and 19 grams protein  NUTRITION DIAGNOSIS:   Increased nutrient needs related to catabolic illness(COVID 19) as evidenced by estimated needs.  GOAL:   Patient will meet greater than or equal to 90% of their needs  MONITOR:   PO intake, Supplement acceptance, Labs, Weight trends, Skin, I & O's  REASON FOR ASSESSMENT:   Consult Diet education  ASSESSMENT:   54 y.o. male with medical history significant of hypertension, diabetes mellitus (A1c 6.5 on 09/07/2019), allergy, recent COVID-19 infection (12/26-1/2), who presents with polyuria, polydipsia and generalized weakness.  RD working remotely.  Spoke with Ian Pennington via phone. Ian Pennington reports poor appetite and oral intake for several weeks but reports that his appetite is improving. Ian Pennington reports eating 75-100% of his meals in hospital. Ian Pennington does report some weight loss but is unsure how much. Ian Pennington reports that he feels he is losing his leg stregnth and has been having trouble mobilizing. RD will add supplements to help Ian Pennington meet his estimated needs. RD provided Ian Pennington with DM diet education today.    Medications reviewed and include: Vitamin C, lovenox, insulin, metformin, MVI, zinc   Labs reviewed: cbgs- 208, 170, 195, 365, 386 x 24 hrs AIC 6.5(H)- 12/28  Unable to complete Nutrition-Focused physical exam at this time.   Diet Order:   Diet Order            Diet heart healthy/carb modified Room service appropriate? Yes; Fluid consistency: Thin  Diet effective now             EDUCATION NEEDS:   Education needs have been addressed  Skin:  Skin Assessment: Reviewed RN Assessment  Last BM:  1/30- TYPE 4  Height:   Ht Readings from Last 1 Encounters:  10/10/19 6\' 4"  (1.93 m)    Weight:   Wt Readings from Last 1 Encounters:  10/10/19 134.8 kg    Ideal Body Weight:  91.8 kg  BMI:  Body  mass index is 36.18 kg/m.  Estimated Nutritional Needs:   Kcal:  3000-3300kcal/day  Protein:  >135g/day  Fluid:  >2.8L/day  10/12/19 MS, RD, LDN Pager #- [phone] Office#- [phone] After Hours Pager: [phone].

## 2019-10-11 NOTE — Plan of Care (Signed)
  Problem: Health Behavior/Discharge Planning: Goal: Ability to manage health-related needs will improve Outcome: Progressing   Problem: Metabolic: Goal: Ability to maintain appropriate glucose levels will improve Outcome: Progressing   Problem: Nutritional: Goal: Maintenance of adequate nutrition will improve Outcome: Progressing   

## 2019-10-11 NOTE — Progress Notes (Signed)
Inpatient Diabetes Program Recommendations  AACE/ADA: New Consensus Statement on Inpatient Glycemic Control   Target Ranges:  Prepandial:   less than 140 mg/dL      Peak postprandial:   less than 180 mg/dL (1-2 hours)      Critically ill patients:  140 - 180 mg/dL  Results for Ian Pennington, Ian Pennington (MRN [id number]) as of 10/11/2019 12:07  Ref. Range 10/10/2019 08:01 10/10/2019 08:56 10/10/2019 10:00 10/10/2019 10:54 10/10/2019 12:31 10/10/2019 13:30 10/10/2019 14:06 10/10/2019 14:55 10/10/2019 16:43 10/10/2019 18:03 10/10/2019 22:59 10/11/2019 07:50 10/11/2019 11:42  Glucose-Capillary Latest Ref Range: 70 - 99 mg/dL 257 (H) 357 (H) 306 (H) 321 (H) 257 (H) 407 (H) 401 (H) 323 (H) 208 (H) 170 (H) 195 (H) 365 (H) 386 (H)   Results for Ian Pennington, Ian Pennington (MRN [id number]) as of 10/11/2019 12:07  Ref. Range 10/09/2019 12:04  Glucose Latest Ref Range: 70 - 99 mg/dL 1,460 [facility])   Results for Ian Pennington, Ian Pennington (MRN [id number]) as of 10/11/2019 12:07  Ref. Range 09/07/2019 10:33  Hemoglobin A1C Latest Ref Range: 4.8 - 5.6 % 6.5 (H)   Review of Glycemic Control  Admit with:Pt to ED via [facility] from home for high blood sugar, polyuria, and polydipsia--hospitalized with COVID in December--Was given steroids in the hospital and was discharged 1/2/21on Decadron 6 mg Q6H for 3 days and has been having high blood sugar since then  Current Orders:Lantus 20 units BID, Novolog 0-20 units AC&HS, Novolog 20 units TID with meals, Metformin 500 mg BID  NOTE: Noted consult for Diabetes Coordinator. Diabetes Coordinator is not on campus over the weekend but available by pager from 8am to 5pm for questions or concerns.  Spoke with patient over the phone about new diabetes diagnosis.  Discussed A1C results (6.5% on 09/07/19) and explained what an A1C is. Explained that due to patient being on steroids during prior hospitalization and being discharged on steroids, the steroids were contributing to hyperglycemia.   Patient confirms that he had  no prior DM hx and he reports have polydipsia, polyuria, blurry vision, and increased tiredness since being discharged from the hospital on 09/12/19.  Discussed basic pathophysiology of DM Type 2, basic home care, importance of checking CBGs and maintaining good CBG control to prevent long-term and short-term complications. Reviewed glucose and A1C goals.   Reviewed signs and symptoms of hyperglycemia and hypoglycemia along with treatment for both. Discussed impact of nutrition, exercise, stress, sickness, and medications on diabetes control. Discussed how steroids and recent COVID impact glycemic control.  Patient reports that he has Living Well with diabetes booklet and an insulin starter kit but he has not started looking at information yet due to blurry vision. Encouraged patient to read through entire book and starter kit once vision improves or have family read to him. Patient states he has no insurance and his girlfriend got him a virtual appointment with [employer] on 10/12/19 at 10:00 am and patient plans to complete the virtual visit tomorrow morning even if still inpatient. Informed patient that TOC should be talking with him as well about follow up and medication needs. Patient is currently out of work and will need medication assistance. Discussed that hopefully he will be able to get at least 30 day supply of medications from [facility]) and if so, they have Basaglar and Humalog insulin. Due to blurry vision, anticipate insulin pens would be best for patient at this time. Patient states he has seen a few people use an insulin pen  in the past. Asked patient to watch youtube video on how to use an insulin pen and informed him that RN will be asked to review the insulin pen with him as well. Discussed current insulin regimen with Lantus and Novolog and explained that if he is able to get medications from [facility], they have Basaglar and Humalog. Discussed each insulin and how they  work. Discussed insulin storage and importance of insulin injection site rotation.  Informed patient that RN will be asking him to self-administer insulin to ensure proper technique and ability to administer self insulin shots. Asked patient to check glucose 4 times a day and to keep a log of glucose readings so when he follows up with a provider they will be able to use the information to make adjustments with DM medications if needed. Encouraged patient to reach out to the provider if he experiences any issues with hypoglycemia.   Patient verbalized understanding of information discussed and he states that he has no further questions at this time related to diabetes.   RNs to provide ongoing basic DM education at bedside with this patient and engage patient to actively check blood glucose and administer insulin injections. If patient is able to get DM medications from [facility] and MD discharges patient on insulin, please provide Rx for: Kara Mead [phone]), Humalog Kwikpen [phone]), and insulin pen needles [phone]) at time of discharge.  Thanks, Barnie Alderman, RN, MSN, CDE Diabetes Coordinator Inpatient Diabetes Program [phone] (Team Pager from 8am to 5pm)

## 2019-10-11 NOTE — Progress Notes (Signed)
Pt educated on use of insulin pens. Pt will return demonstration of insulin injection at next scheduled dose. RN discussed signs and symptoms of hypo and hyperglycemia. Pt verbalized understanding. I will continue to assess.

## 2019-10-11 NOTE — Progress Notes (Signed)
PROGRESS NOTE    Ian Pennington  LKT:[id number] DOB: 29-Jul-1966 DOA: 10/09/2019  PCP: Patient, No Pcp Per    LOS - 2   Brief Narrative:  Ian Pennington a 54 y.o.malewith medical history significant ofhypertension, diabetes mellitus (A1c 6.5 on 09/07/2019), allergy, recent COVID-19 infection (12/26-1/2), who presents with progressive polyuria, polydipsia, blurry vision and generalized weakness x 3 weeks.  He was admitted from 09/05/19-09/12/2019 due to COVID-19 infection, completed remdesivir and steroids. No prior diagnosis of diabetes but did require signficant insulin for glucose control, was not discharged on any insulin or meds, close PCP follow up was recommended.  In the ED, found to have blood glucose of 1460 with normal anion gap, consistent with HHS. Patient was started on insulin drip and admitted to stepdown unit for further management.  Subjective 1/31: Patient seen this morning at bedside.  No acute events reported overnight.  He reports still has blurry vision, pretty much unchanged.  He was reassured when I told him the ophthalmologist would follow-up outpatient and expect this to improve in a couple weeks.  Reports ongoing right knee pain which is chronic but flared currently.  He says tramadol helps.  Had not yet tried the Voltaren gel but agrees to it.  Denies fevers or chills, chest pain or shortness of breath, nausea vomiting or diarrhea.  Assessment & Plan:   Principal Problem:   Type 2 diabetes mellitus with hyperosmolar hyperglycemic state (HHS) ([place]) Active Problems:   Pneumonia due to COVID-19 virus   HTN (hypertension)   AKI (acute kidney injury) ([place])   Type 2 diabetes mellitus with hyperosmolar hyperglycemic state (HHS) ([place]): Presented with BG 1460, normal anion gap with symptoms of Polyuria, polydipsia, weakness, blurry vision.  A1C 6.5% on 09/07/19, but not on medications outpatient yet. Prior admission needed insulin while on steroids.  Off insulin  drip.  Quite resistant to insulin so far, today required 40 units with breakfast and lunch total.  Will uptitrate the basal to hopefully reduce mealtime requirement. --titrate insulin up as needed --Increase Lantus to 40 units twice daily --Continue Novolog 20 units TID WC + resistant sliding scale --started metformin 500 mg BID --once appropriate insulin regimen is determined and patient trained/educated on administering it, can discharge with close PCP followup  Blurry Vision - secondary to uncontrolled blood glucose.  Discussed with ophthalmologist) likely this is lens swelling which will resolve over a couple of weeks OR macular edema which is best evaluated outpatient with OCT.  Recommended outpatient followup.  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  Having radiation down to calf.  No trauma or injury. --monitor & consider imaging if not improving or getting worse --pain control w Voltaren gel and PRN tramadol --PT evaluation  Recent pneumonia due to COVID-19 virus:Chest x-ray showed significant improvement of prior infiltrates. Patient still has mild cough and mild shortness of breath.  Lungs are clear. -As needed bronchodilators, andprnMucinex  Hypertension -resume HCTZ -As needed hydralazine  AKI (acute kidney injury):Most likely due to prerenal secondary to dehydration with HHS --improved with IV hydration --monitor BMP    DVT prophylaxis: Lovenox   Code Status: Full Code  Family Communication: none at bedside  Disposition Plan:  Expect d/c home pending improvement.    Expect he will need at least 2 more days for titrating insulin for adequate blood glucose control.  Patient continues to require inpatient hospital care due to uncontrolled blood sugars and adjusting insulin dosing as outlined above.   Consultants:  Dietician  Diabetic Coordinator  Procedures:   None  Antimicrobials:   None    Objective: Vitals:   10/10/19 1700  10/10/19 2007 10/11/19 0431 10/11/19 0749  BP:  118/68 122/73 128/81  Pulse:  98 91   Resp: (!) 28 20 16  (!) 21  Temp:  97.7 F (36.5 C) 98.2 F (36.8 C)   TempSrc:  Oral Oral   SpO2:  97% 96%   Weight:  134.8 kg    Height:  6' 4"  (1.93 m)      Intake/Output Summary (Last 24 hours) at 10/11/2019 0759 Last data filed at 10/11/2019 0105 Gross per 24 hour  Intake 1000 ml  Output 1450 ml  Net -450 ml   Filed Weights   10/09/19 1140 10/09/19 1842 10/10/19 2007  Weight: 134.3 kg 126.6 kg 134.8 kg    Examination:  General exam: awake, alert, no acute distress, obese Respiratory system: clear to auscultation bilaterally, no wheezes, rales or rhonchi, normal respiratory effort. Cardiovascular system: normal S1/S2, RRR, no JVD, murmurs, rubs, gallops, trace lower extremity edema.   Central nervous system: alert and oriented x4. no gross focal neurologic deficits, normal speech Extremities: moves all, no cyanosis, normal tone, right knee tender on palpation Skin: dry, intact, normal temperature Psychiatry: normal mood, congruent affect, judgement and insight appear normal    Data Reviewed: I have personally reviewed following labs and imaging studies  CBC: Recent Labs  Lab 10/09/19 1204 10/11/19 0627  WBC 7.3 5.3  HGB 14.4 12.7*  HCT 45.7 37.7*  MCV 95.8 90.6  PLT 336 153   Basic Metabolic Panel: Recent Labs  Lab 10/09/19 2316 10/10/19 0254 10/10/19 0621 10/10/19 1031 10/11/19 0627  NA 144 148* 147* 140 138  K 3.0* 3.0* 4.5 3.7 4.4  CL 110 112* 110 107 107  CO2 26 27 28 23 23   GLUCOSE 291* 135* 248* 319* 368*  BUN 19 19 20 20 16   CREATININE 0.97 0.84 0.88 0.90 0.87  CALCIUM 8.7* 8.6* 8.6* 8.3* 8.2*  MG  --   --  2.0  --  2.0  PHOS  --   --  2.3*  --   --    GFR: Estimated Creatinine Clearance: 143.8 mL/min (by C-G formula based on SCr of 0.87 mg/dL). Liver Function Tests: Recent Labs  Lab 10/09/19 1204  AST 55*  ALT 102*  ALKPHOS 61  BILITOT 1.4*    PROT 5.7*  ALBUMIN 3.1*   Recent Labs  Lab 10/10/19 0621  LIPASE 75*   No results for input(s): AMMONIA in the last 168 hours. Coagulation Profile: No results for input(s): INR, PROTIME in the last 168 hours. Cardiac Enzymes: No results for input(s): CKTOTAL, CKMB, CKMBINDEX, TROPONINI in the last 168 hours. BNP (last 3 results) No results for input(s): PROBNP in the last 8760 hours. HbA1C: No results for input(s): HGBA1C in the last 72 hours. CBG: Recent Labs  Lab 10/10/19 1455 10/10/19 1643 10/10/19 1803 10/10/19 2259 10/11/19 0750  GLUCAP 323* 208* 170* 195* 365*   Lipid Profile: No results for input(s): CHOL, HDL, LDLCALC, TRIG, CHOLHDL, LDLDIRECT in the last 72 hours. Thyroid Function Tests: No results for input(s): TSH, T4TOTAL, FREET4, T3FREE, THYROIDAB in the last 72 hours. Anemia Panel: No results for input(s): VITAMINB12, FOLATE, FERRITIN, TIBC, IRON, RETICCTPCT in the last 72 hours. Sepsis Labs: No results for input(s): PROCALCITON, LATICACIDVEN in the last 168 hours.  Recent Results (from the past 240 hour(s))  MRSA PCR Screening  Status: None   Collection Time: 10/09/19  6:46 PM   Specimen: Nasopharyngeal  Result Value Ref Range Status   MRSA by PCR NEGATIVE NEGATIVE Final    Comment:        The GeneXpert MRSA Assay (FDA approved for NASAL specimens only), is one component of a comprehensive MRSA colonization surveillance program. It is not intended to diagnose MRSA infection nor to guide or monitor treatment for MRSA infections. Performed at [facility], [address], [place], [place] [address]          Radiology Studies: DG Chest Portable 1 View  Result Date: 10/09/2019 CLINICAL DATA:  Shortness breath EXAM: PORTABLE CHEST 1 VIEW COMPARISON:  Portable exam 1155 hours compared to 09/05/2019 FINDINGS: Enlargement of cardiac silhouette. Mediastinal contours and pulmonary vascularity normal. Improved airspace infiltrates,  minimal residuals at RIGHT base and in LEFT upper lobe. No pleural effusion or pneumothorax. IMPRESSION: Significantly improved pulmonary infiltrates. Electronically Signed   By: Lavonia Dana M.D.   On: 10/09/2019 12:05        Scheduled Meds: . ascorbic acid  500 mg Oral Daily  . Chlorhexidine Gluconate Cloth  6 each Topical Daily  . dextromethorphan-guaiFENesin  1 tablet Oral BID  . diclofenac Sodium  2 g Topical QID  . enoxaparin (LOVENOX) injection  40 mg Subcutaneous Q24H  . gabapentin  300 mg Oral BID  . hydrochlorothiazide  50 mg Oral Daily  . insulin aspart  0-20 Units Subcutaneous TID AC & HS  . insulin aspart  20 Units Subcutaneous TID WC  . insulin glargine  20 Units Subcutaneous BID  . insulin starter kit- pen needles  1 kit Other Once  . metFORMIN  500 mg Oral BID WC  . multivitamin with minerals  1 tablet Oral Daily  . zinc sulfate  220 mg Oral Daily   Continuous Infusions:   LOS: 2 days    Time spent: 35 minutes    Ezekiel Slocumb, DO [facility]   If 7PM-7AM, please contact night-coverage [email or web address] 10/11/2019, 7:59 AM

## 2019-10-11 NOTE — Plan of Care (Signed)
Nutrition Education Note   RD consulted for nutrition education regarding diabetes.   Lab Results  Component Value Date   HGBA1C 6.5 (H) 09/07/2019    RD provided "Nutrition and Type II Diabetes" handout from the Academy of Nutrition and Dietetics. Discussed different food groups and their effects on blood sugar, emphasizing carbohydrate-containing foods. Provided list of carbohydrates and recommended serving sizes of common foods.  Discussed importance of controlled and consistent carbohydrate intake throughout the day. Provided examples of ways to balance meals/snacks and encouraged intake of high-fiber, whole grain complex carbohydrates. Teach back method used.  Expect fair compliance.  Body mass index is 36.18 kg/m. Pt meets criteria for obesity based on current BMI.  Current diet order is HH/CHO, patient is consuming approximately 100% of meals at this time. Labs and medications reviewed. No further nutrition interventions warranted at this time. RD contact information provided. If additional nutrition issues arise, please re-consult RD.  Betsey Holiday MS, RD, LDN Pager #- [phone] Office#- [phone] After Hours Pager: [phone]

## 2019-10-12 LAB — BASIC METABOLIC PANEL
Anion gap: 7 (ref 5–15)
BUN: 20 mg/dL (ref 6–20)
CO2: 29 mmol/L (ref 22–32)
Calcium: 8.5 mg/dL — ABNORMAL LOW (ref 8.9–10.3)
Chloride: 102 mmol/L (ref 98–111)
Creatinine, Ser: 0.75 mg/dL (ref 0.61–1.24)
GFR calc Af Amer: >60 mL/min (ref >60)
GFR calc non Af Amer: >60 mL/min (ref >60)
Glucose, Bld: 306 mg/dL — ABNORMAL HIGH (ref 70–99)
Potassium: 3.9 mmol/L (ref 3.5–5.1)
Sodium: 138 mmol/L (ref 135–145)

## 2019-10-12 LAB — GLUCOSE, CAPILLARY
Glucose-Capillary: 385 mg/dL — ABNORMAL HIGH (ref 70–99)
Glucose-Capillary: 172 mg/dL — ABNORMAL HIGH (ref 70–99)
Glucose-Capillary: 92 mg/dL (ref 70–99)
Glucose-Capillary: 328 mg/dL — ABNORMAL HIGH (ref 70–99)
Glucose-Capillary: 235 mg/dL — ABNORMAL HIGH (ref 70–99)
Glucose-Capillary: 179 mg/dL — ABNORMAL HIGH (ref 70–99)
Glucose-Capillary: 179 mg/dL — ABNORMAL HIGH (ref 70–99)

## 2019-10-12 LAB — MAGNESIUM: Magnesium: 1.8 mg/dL (ref 1.7–2.4)

## 2019-10-12 MED ORDER — INSULIN ASPART 100 UNIT/ML ~~LOC~~ SOLN
25.0000 [IU] | Freq: Three times a day (TID) | SUBCUTANEOUS | Status: DC
  Administered 2019-10-12 – 2019-10-13 (×5): 25 [IU] via SUBCUTANEOUS
  Filled 2019-10-12 (×5): qty 1

## 2019-10-12 MED ORDER — SODIUM CHLORIDE 0.9% FLUSH
10.0000 mL | Freq: Two times a day (BID) | INTRAVENOUS | Status: DC
  Administered 2019-10-12 – 2019-10-13 (×2): 10 mL via INTRAVENOUS

## 2019-10-12 MED ORDER — METFORMIN HCL 500 MG PO TABS
1000.0000 mg | ORAL_TABLET | Freq: Two times a day (BID) | ORAL | Status: DC
  Administered 2019-10-13: 09:00:00 1000 mg via ORAL
  Filled 2019-10-12: qty 2

## 2019-10-12 MED ORDER — INSULIN GLARGINE 100 UNIT/ML ~~LOC~~ SOLN
60.0000 [IU] | Freq: Two times a day (BID) | SUBCUTANEOUS | Status: DC
  Administered 2019-10-12 – 2019-10-13 (×3): 60 [IU] via SUBCUTANEOUS
  Filled 2019-10-12 (×4): qty 0.6

## 2019-10-12 NOTE — TOC Initial Note (Signed)
Transition of Care [facility]) - Initial/Assessment Note    Patient Details  Name: Ian Pennington MRN: [id number] Date of Birth: 05/26/1966  Transition of Care [facility]) CM/SW Contact:    Trenton Founds, RN Phone Number: 10/12/2019, 3:06 PM  Clinical Narrative:    RNCM assessed patient at bedside, patient sitting up in chair eating and watching tv on arrival. Patient reports to feeling some better but reports that his legs are still hurting and he feels like he is still having some trouble moving around. Patient reports that he lives alone in a single family home and has a girlfriend that helps him. Patient reports that he hasn't driven since before Christmas but that he gets his prescriptions from [place] and has no trouble picking them up. Patient is agreeable to [facility] services to aid in his mobility. RNCM placed call to [facility] with [facility]urbish as they are listed for charity this week but had to leave a VM. RNCM will continue to follow.                Expected Discharge Plan: Home w Home Health Services Barriers to Discharge: Continued Medical Work up   Patient Goals and CMS Choice     Choice offered to / list presented to : Patient  Expected Discharge Plan and Services Expected Discharge Plan: Home w Home Health Services   Discharge Planning Services: CM Consult   Living arrangements for the past 2 months: Single Family Home                                      Prior Living Arrangements/Services Living arrangements for the past 2 months: Single Family Home Lives with:: Self   Do you feel safe going back to the place where you live?: Yes      Need for Family Participation in Patient Care: Yes (Comment) Care giver support system in place?: Yes (comment)   Criminal Activity/Legal Involvement Pertinent to Current Situation/Hospitalization: No - Comment as needed  Activities of Daily Living      Permission Sought/Granted                  Emotional  Assessment Appearance:: Appears stated age Attitude/Demeanor/Rapport: Engaged Affect (typically observed): Appropriate Orientation: : Oriented to Self, Oriented to Place, Oriented to  Time, Oriented to Situation   Psych Involvement: No (comment)  Admission diagnosis:  Hyperglycemic crisis in diabetes mellitus (HCC) [E11.65] Type 2 diabetes mellitus with hyperosmolar hyperglycemic state (HHS) (HCC) [E11.00, E11.65] Patient Active Problem List   Diagnosis Date Noted  . Type 2 diabetes mellitus with hyperosmolar hyperglycemic state (HHS) (HCC) 10/09/2019  . HTN (hypertension) 10/09/2019  . AKI (acute kidney injury) (HCC) 10/09/2019  . Acute respiratory failure with hypoxia (HCC) 09/05/2019  . Pneumonia due to COVID-19 virus 09/05/2019  . Abscess, periappendiceal 04/17/2015  . Acute appendicitis 04/12/2015   PCP:  Patient, No Pcp Per Pharmacy:   [facility] - [place], [place] - [address] [place] [address] Phone: [phone] Fax: [phone]     Social Determinants of Health (SDOH) Interventions    Readmission Risk Interventions No flowsheet data found.

## 2019-10-12 NOTE — Progress Notes (Addendum)
CCMD called and reported that pt HR up to 128 non sustain while ambulating to the bathroom. Pt asymptomatic. Notify prime. Will continue to monitor.  Update 0100: Talked to [facility] but no new order was placed but continue to monitor. Will continue to monitor.

## 2019-10-12 NOTE — Evaluation (Signed)
Occupational Therapy Evaluation Patient Details Name: Ian Pennington MRN: [id number] DOB: Sep 01, 1966 Today's Date: 10/12/2019    History of Present Illness 54 y.o. male with medical history significant of hypertension, diabetes mellitus (A1c 6.5 on 09/07/2019), allergy, recent COVID-19 infection (12/26-1/2), who presents with progressive polyuria, polydipsia, blurry vision and generalized weakness x 3 weeks.  He was admitted from 09/05/19 - 09/12/2019 due to COVID-19 infection, completed remdesivir and steroids.  No prior diagnosis of diabetes but did require signficant insulin for glucose control, was not discharged on any insulin or meds, close PCP follow up was recommended.  In the ED, found to have blood glucose of 1460. At time of eval 328.   Clinical Impression   Pt is 54 year old male with recent COVID infection, DM and HTN with blurry vision and generalized weakness.Marland Kitchen  His blood glucose was 1460 on admission and was 235 when CNA took vitals and BS during eval.  Pt was independent in all ADLs prior to COVID but since then he has needed min assist for dressing and shower transfers using grab bars.  He presents with R knee pain and needed in to mod assist to complete LB dressing on R and educated in use of reacher and sock aid at home to increase independence in LB dressing.  He has a Secondary school teacher at home he can use but did not feel he needed a sock aid.  His HR was fluctuating a lot when talking and CNA indicated it was up to 114 when talking and then decreased to 107 with deep breathing.  Educated in APPs on his cell phone to help with pain management and decreasing stress and anxiety and his girlfriend was familiar with them.  Strongly rec use of a shower chair with a back and not a stool for showering due to risk of falling.  He has grab bars in his shower but does not have room by toilet for any bars or 3 in 1 over toilet.   Pt currently requires min assist for LB dressing while in seated position due  to pain and limited AROM of R knee.  Pt would benefit from instruction in dressing techniques with assistive devices for dressing and bathing skills.  Pt would also benefit from recommendations for pain management, decreasing anxiety, energy conservation tech and home modifications to increase safety in the bathroom and prevent falls. Will assess for OT [facility] needs as pt progresses in therapy but most likely will not need any after DC..      Follow Up Recommendations  No OT follow up    Equipment Recommendations  Tub/shower seat    Recommendations for Other Services       Precautions / Restrictions Precautions Precautions: None Precaution Comments: chronic R knee pain, watch glucose Restrictions Weight Bearing Restrictions: No      Mobility Bed Mobility Overal bed mobility: Modified Independent             General bed mobility comments: sitting EOB at start of session  Transfers Overall transfer level: Needs assistance Equipment used: Rolling walker (2 wheeled) Transfers: Sit to/from Stand Sit to Stand: Min guard              Balance Overall balance assessment: Needs assistance Sitting-balance support: Feet supported Sitting balance-Leahy Scale: Good       Standing balance-Leahy Scale: Fair Standing balance comment: Able to maintain balance with static standing, reliant on UE for weight shifts and balance due to R knee pain.  ADL either performed or assessed with clinical judgement   ADL Overall ADL's : Needs assistance/impaired Eating/Feeding: Independent;Set up;Sitting   Grooming: Wash/dry hands;Wash/dry face;Oral care;Brushing hair;Applying deodorant;Independent;Set up;Sitting           Upper Body Dressing : Independent;Set up   Lower Body Dressing: Minimal assistance;Set up;Sit to/from stand Lower Body Dressing Details (indicate cue type and reason): Pt required min to mod assist for dressing LB due to R knee pain  and educated in use of reacher and sock aid and able to complete with min cues and no knee pain and has a reacher at home but did not know he could use reacher for dressing purposes. Toilet Transfer: Minimal assistance;Set up;Regular Toilet;Grab bars;RW   Toileting- Clothing Manipulation and Hygiene: Minimal assistance;Sit to/from stand;Set up         General ADL Comments: Pt is eager to regain independence in ADLs but is limited due to pain in R knee and has some shortness of breath when talking to answer questions and HR increased to 114 per CNA who was taking vitals during session and educated in pursed lip breathing and discussed APPs on his phone to help with decreasing pain and HR (HeadSpace and Calm APPs which his girfriend was familiar with and in room listening to rec).     Vision Patient Visual Report: Blurring of vision Additional Comments: Pt reported that his vision was blurry but as his blood sugars are improving so is his vision.  Blood sugar was 306 and now 235.     Perception     Praxis      Pertinent Vitals/Pain Pain Assessment: 0-10 Pain Score: 3  Pain Location: R knee Pain Descriptors / Indicators: Aching;Sore Pain Intervention(s): Limited activity within patient's tolerance;Repositioned;Relaxation;Monitored during session;Utilized relaxation techniques     Hand Dominance Left   Extremity/Trunk Assessment Upper Extremity Assessment Upper Extremity Assessment: Overall WFL for tasks assessed   Lower Extremity Assessment Lower Extremity Assessment: Defer to PT evaluation RLE Deficits / Details: significant R knee pain, unable to perform full knee extension due to increased pain, formal MMT deferred LLE Deficits / Details: some L knee pain noted, not as significant as R knee. grossly 4/5   Cervical / Trunk Assessment Cervical / Trunk Assessment: Normal   Communication Communication Communication: No difficulties   Cognition Arousal/Alertness:  Awake/alert Behavior During Therapy: WFL for tasks assessed/performed Overall Cognitive Status: Within Functional Limits for tasks assessed                                     General Comments       Exercises     Shoulder Instructions      Home Living Family/patient expects to be discharged to:: Private residence Living Arrangements: Spouse/significant other Available Help at Discharge: Family Type of Home: House Home Access: Stairs to enter Secretary/administrator of Steps: 5 Entrance Stairs-Rails: Right Home Layout: One level     Bathroom Shower/Tub: Chief Strategy Officer: Standard     Home Equipment: Environmental consultant - 2 wheels;Grab bars - tub/shower;Hand held shower head;Adaptive equipment Adaptive Equipment: Reacher        Prior Functioning/Environment Level of Independence: Independent        Comments: Painter/pressure washer. Has needed assistance with shower transfers due to increased weakness lately. denies any falls        OT Problem List: Decreased strength;Decreased activity tolerance;Pain;Impaired balance (sitting and/or  standing);Cardiopulmonary status limiting activity;Decreased knowledge of use of DME or AE;Decreased range of motion      OT Treatment/Interventions: Self-care/ADL training;Energy conservation;DME and/or AE instruction;Therapeutic activities;Patient/family education;Balance training    OT Goals(Current goals can be found in the care plan section) Acute Rehab OT Goals Patient Stated Goal: to regain my strength to take care of myself again OT Goal Formulation: With patient/family Time For Goal Achievement: 10/19/19 Potential to Achieve Goals: Good ADL Goals Pt Will Perform Lower Body Dressing: with set-up;with supervision;with adaptive equipment;sit to/from stand Pt Will Transfer to Toilet: with set-up;with min guard assist;regular height toilet;stand pivot transfer;ambulating Pt/caregiver will Perform Home Exercise  Program: Independently;With written HEP provided  OT Frequency: Min 1X/week   Barriers to D/C:            Co-evaluation              AM-PAC OT "6 Clicks" Daily Activity     Outcome Measure Help from another person eating meals?: None Help from another person taking care of personal grooming?: None Help from another person toileting, which includes using toliet, bedpan, or urinal?: A Little Help from another person bathing (including washing, rinsing, drying)?: A Little Help from another person to put on and taking off regular upper body clothing?: None Help from another person to put on and taking off regular lower body clothing?: A Little 6 Click Score: 21   End of Session Equipment Utilized During Treatment: Gait belt  Activity Tolerance: Patient tolerated treatment well Patient left: in bed;with call bell/phone within reach;with family/visitor present  OT Visit Diagnosis: Unsteadiness on feet (R26.81);Pain;Muscle weakness (generalized) (M62.81) Pain - Right/Left: Right Pain - part of body: Knee                Time: 1120-1200 OT Time Calculation (min): 40 min Charges:  OT General Charges $OT Visit: 1 Visit OT Evaluation $OT Eval Low Complexity: 1 Low OT Treatments $Self Care/Home Management : 23-37 mins  Susanne Borders, OTR/L, [place] ascom [phone] 10/12/19, 12:24 PM

## 2019-10-12 NOTE — Plan of Care (Signed)

## 2019-10-12 NOTE — Progress Notes (Signed)
PROGRESS NOTE    VERNER MCCRONE  QIW:[id number] DOB: [birth year] DOA: 10/09/2019  PCP: Patient, No Pcp Per    LOS - 3   Brief Narrative:  Ian Fore Harlowis a 54 y.o.malewith medical history significant ofhypertension, diabetes mellitus (A1c 6.5 on 09/07/2019), allergy, recent COVID-19 infection (12/26-1/2), who presents withprogressivepolyuria, polydipsia, blurry visionand generalized weakness x 3 weeks.He was admittedfrom 09/05/19-09/12/2019 due to COVID-19 infection, completed remdesivir and steroids.No prior diagnosis of diabetes but did require signficant insulin for glucose control, was not discharged on any insulin or meds, close PCP follow up was recommended. In the ED, found to have blood glucose of 1460 with normal anion gap, consistent with HHS. Patient was started on insulin drip and admitted to stepdown unit for further management.  Subjective 2/1: Patient seen this morning, up in chair after breakfast.  No acute events reported overnight.  Patient reports continued knee and leg pain.  States his right knee woke up from sleep last night.  Says the medications help some but do not last quite long enough.  Says Voltaren gel helped just a little bit.  Denies fevers or chills, nausea vomiting or diarrhea, chest pain or shortness of breath.  Assessment & Plan:   Principal Problem:   Type 2 diabetes mellitus with hyperosmolar hyperglycemic state (HHS) ([place]) Active Problems:   Pneumonia due to COVID-19 virus   HTN (hypertension)   AKI (acute kidney injury) ([place])   Type 2 diabetes mellitus with hyperosmolar hyperglycemic state (HHS) ([place]): Presented with BG1460, normal anion gap with symptoms of Polyuria, polydipsia, weakness, blurry vision. A1C 6.5%on 09/07/19, but not on medications outpatient yet. Prior admission needed insulin while on steroids. Off insulin drip.  Quite resistant to insulin so far, today required 40 units with breakfast and lunch total.  Will  uptitrate the basal to hopefully reduce mealtime requirement. --titrate insulin up as needed --Increase Lantus to 60 units twice daily --Continue Novolog 25 units TID WC + resistant sliding scale --started metformin 500 mg BID, tolerating, will increase to 1000 BID --once appropriate insulin regimen is determined and patient trained/educated on administering it, can discharge with close PCP followup --will require medication assistance - TOC involved  Blurry Vision - secondary to uncontrolled blood glucose. Discussed with ophthalmologist) likely this is lens swelling which will resolve over a couple of weeks OR macular edema which is best evaluated outpatient with OCT. Recommended outpatient followup.  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 Having radiation down to calf. No trauma or injury. --monitor & consider imaging if not improving or getting worse --pain control w Voltaren gel and PRN tramadol --PT evaluation  Recent pneumonia due to COVID-19 virus:Chest x-ray showed significant improvementof prior infiltrates. Patient still has mild cough and mild shortness of breath.  Lungs are clear. -As needed bronchodilators, andprnMucinex  Hypertension -resume HCTZ -As needed hydralazine  AKI (acute kidney injury):Most likely due to prerenal secondary to dehydrationwith HHS --improved with IV hydration --monitor BMP    DVT prophylaxis:Lovenox Code Status: Full Code Family Communication:none at bedside Disposition Plan:Expect d/c home pending improvement.   Expect he will need at least 2 more days for titrating insulin for adequate blood glucose control.  Patient continues to require inpatient hospital care due to uncontrolled blood sugars and adjusting insulin dosing as outlined above.   Consultants:  Dietician  Diabetic Coordinator  Procedures:  None  Antimicrobials:  None   Objective: Vitals:   10/11/19 1944 10/12/19  0026 10/12/19 0043 10/12/19 0429  BP: 105/73 130/88  110/66  Pulse: (!) 102 (!) 118 (!) 110 100  Resp: 19   19  Temp: 98.7 F (37.1 C) 97.7 F (36.5 C)  98.3 F (36.8 C)  TempSrc: Oral Oral  Oral  SpO2: 96% 96%  94%  Weight:      Height:        Intake/Output Summary (Last 24 hours) at 10/12/2019 0751 Last data filed at 10/12/2019 0656 Gross per 24 hour  Intake 120 ml  Output 3840 ml  Net -3720 ml   Filed Weights   10/09/19 1140 10/09/19 1842 10/10/19 2007  Weight: 134.3 kg 126.6 kg 134.8 kg    Examination:  General exam: awake, alert, no acute distress, obese HEENT: moist mucus membranes, hearing grossly normal  Respiratory system: clear to auscultation bilaterally, no wheezes, rales or rhonchi, normal respiratory effort. Cardiovascular system: normal S1/S2, RRR, no JVD, murmurs, rubs, gallops, no pedal edema.   Gastrointestinal system: soft, non-tender, non-distended abdomen, no organomegaly or masses felt, normal bowel sounds. Central nervous system: alert and oriented x3. no gross focal neurologic deficits, normal speech Extremities: moves all, no edema, normal tone Skin: dry, intact, normal temperature Psychiatry: normal mood, congruent affect, judgement and insight appear normal    Data Reviewed: I have personally reviewed following labs and imaging studies  CBC: Recent Labs  Lab 10/09/19 1204 10/11/19 0627  WBC 7.3 5.3  HGB 14.4 12.7*  HCT 45.7 37.7*  MCV 95.8 90.6  PLT 336 122   Basic Metabolic Panel: Recent Labs  Lab 10/10/19 0254 10/10/19 0621 10/10/19 1031 10/11/19 0627 10/12/19 0533  NA 148* 147* 140 138 138  K 3.0* 4.5 3.7 4.4 3.9  CL 112* 110 107 107 102  CO2 27 28 23 23 29   GLUCOSE 135* 248* 319* 368* 306*  BUN 19 20 20 16 20   CREATININE 0.84 0.88 0.90 0.87 0.75  CALCIUM 8.6* 8.6* 8.3* 8.2* 8.5*  MG  --  2.0  --  2.0 1.8  PHOS  --  2.3*  --   --   --    GFR: Estimated Creatinine Clearance: 156.4 mL/min (by C-G formula based on SCr of  0.75 mg/dL). Liver Function Tests: Recent Labs  Lab 10/09/19 1204  AST 55*  ALT 102*  ALKPHOS 61  BILITOT 1.4*  PROT 5.7*  ALBUMIN 3.1*   Recent Labs  Lab 10/10/19 0621  LIPASE 75*   No results for input(s): AMMONIA in the last 168 hours. Coagulation Profile: No results for input(s): INR, PROTIME in the last 168 hours. Cardiac Enzymes: No results for input(s): CKTOTAL, CKMB, CKMBINDEX, TROPONINI in the last 168 hours. BNP (last 3 results) No results for input(s): PROBNP in the last 8760 hours. HbA1C: No results for input(s): HGBA1C in the last 72 hours. CBG: Recent Labs  Lab 10/10/19 2259 10/11/19 0750 10/11/19 1142 10/11/19 1701 10/11/19 2115  GLUCAP 195* 365* 386* 165* 245*   Lipid Profile: No results for input(s): CHOL, HDL, LDLCALC, TRIG, CHOLHDL, LDLDIRECT in the last 72 hours. Thyroid Function Tests: No results for input(s): TSH, T4TOTAL, FREET4, T3FREE, THYROIDAB in the last 72 hours. Anemia Panel: No results for input(s): VITAMINB12, FOLATE, FERRITIN, TIBC, IRON, RETICCTPCT in the last 72 hours. Sepsis Labs: No results for input(s): PROCALCITON, LATICACIDVEN in the last 168 hours.  Recent Results (from the past 240 hour(s))  MRSA PCR Screening     Status: None   Collection Time: 10/09/19  6:46 PM   Specimen: Nasopharyngeal  Result Value Ref Range Status  MRSA by PCR NEGATIVE NEGATIVE Final    Comment:        The GeneXpert MRSA Assay (FDA approved for NASAL specimens only), is one component of a comprehensive MRSA colonization surveillance program. It is not intended to diagnose MRSA infection nor to guide or monitor treatment for MRSA infections. Performed at [facility], [address] [place], [place] [address]          Radiology Studies: No results found.      Scheduled Meds: . ascorbic acid  500 mg Oral Daily  . Chlorhexidine Gluconate Cloth  6 each Topical Daily  . dextromethorphan-guaiFENesin  1 tablet Oral BID    . diclofenac Sodium  2 g Topical QID  . enoxaparin (LOVENOX) injection  40 mg Subcutaneous Q24H  . feeding supplement (NEPRO CARB STEADY)  237 mL Oral TID BM  . gabapentin  300 mg Oral BID  . hydrochlorothiazide  50 mg Oral Daily  . insulin aspart  0-20 Units Subcutaneous TID AC & HS  . insulin aspart  25 Units Subcutaneous TID WC  . insulin glargine  60 Units Subcutaneous BID  . insulin starter kit- pen needles  1 kit Other Once  . metFORMIN  500 mg Oral BID WC  . multivitamin with minerals  1 tablet Oral Daily  . zinc sulfate  220 mg Oral Daily   Continuous Infusions:   LOS: 3 days    Time spent: 30-35 minutes    Ezekiel Slocumb, DO [facility]   If 7PM-7AM, please contact night-coverage [email or web address] 10/12/2019, 7:51 AM

## 2019-10-12 NOTE — Evaluation (Signed)
Physical Therapy Evaluation Patient Details Name: Ian Pennington MRN: [id number] DOB: [birth year] Today's Date: 10/12/2019   History of Present Illness  54 y.o. male with medical history significant of hypertension, diabetes mellitus (A1c 6.5 on 09/07/2019), allergy, recent COVID-19 infection (12/26-1/2), who presents with progressive polyuria, polydipsia, blurry vision and generalized weakness x 3 weeks.  He was admitted from 09/05/19 - 09/12/2019 due to COVID-19 infection, completed remdesivir and steroids.  No prior diagnosis of diabetes but did require signficant insulin for glucose control, was not discharged on any insulin or meds, close PCP follow up was recommended.  In the ED, found to have blood glucose of 1460. At time of eval 328.    Clinical Impression  Patient alert, sitting EOB at start of session, reported R knee pain 3-4/10. The patient stated he lives with his girlfriend, was independent in ADLs, ambulation up until covid infection. Upon returning home the patient has had increased R knee pain limiting mobility as well as assistance/supervision to transfer in/out of the shower for safety. Denies falls.  The patient demonstrated sit <> stand with RW and proper hand technique, CGA for safety. Pt instructed in step to gait pattern to minimize RLE weight bearing due to pain. Pt able to perform with no cueing at end of ambulation. HR monitored 120s-130 BPM with exertional ambulation. Further mobility held due to increased pain, RN notified of patient request for medication. PT and pt also reviewed stair navigation for safe entry into home, pt verbalized understanding.  Overall the patient demonstrated deficits (see "PT Problem List") that impede the patient's functional abilities, safety, and mobility and would benefit from skilled PT intervention. Recommendation is HHPT with supervision for mobility/OOB.   Follow Up Recommendations Home health PT;Supervision for mobility/OOB    Equipment  Recommendations  Rolling walker with 5" wheels;Other (comment)(Pt reported he as a RW at home)    Recommendations for Other Services OT consult     Precautions / Restrictions Precautions Precaution Comments: chronic R knee pain, watch glucose Restrictions Weight Bearing Restrictions: No      Mobility  Bed Mobility Overal bed mobility: Modified Independent             General bed mobility comments: sitting EOB at start of session  Transfers Overall transfer level: Needs assistance Equipment used: Rolling walker (2 wheeled) Transfers: Sit to/from Stand Sit to Stand: Min guard            Ambulation/Gait Ambulation/Gait assistance: Min guard Gait Distance (Feet): 15 Feet Assistive device: Rolling walker (2 wheeled)       General Gait Details: educated on step to gait pattern to minimize pain of RLE. Pt HR in 120s and 130 at the most.  Stairs            Wheelchair Mobility    Modified Rankin (Stroke Patients Only)       Balance Overall balance assessment: Needs assistance Sitting-balance support: Feet supported Sitting balance-Leahy Scale: Good       Standing balance-Leahy Scale: Fair Standing balance comment: Able to maintain balance with static standing, reliant on UE for weight shifts and balance due to R knee pain.                             Pertinent Vitals/Pain Pain Assessment: 0-10 Pain Score: 4  Pain Location: R knee Pain Descriptors / Indicators: Aching;Sore Pain Intervention(s): Limited activity within patient's tolerance;Repositioned;Monitored during session    Home  Living Family/patient expects to be discharged to:: Private residence Living Arrangements: Spouse/significant other Available Help at Discharge: Family Type of Home: House Home Access: Stairs to enter Entrance Stairs-Rails: Right Entrance Stairs-Number of Steps: 5 Home Layout: One level Home Equipment: Walker - 2 wheels      Prior Function Level of  Independence: Independent         Comments: Painter/pressure washer. Has needed assistance with shower transfers due to increased weakness lately. denies any falls     Hand Dominance        Extremity/Trunk Assessment   Upper Extremity Assessment Upper Extremity Assessment: Overall WFL for tasks assessed    Lower Extremity Assessment Lower Extremity Assessment: RLE deficits/detail;LLE deficits/detail RLE Deficits / Details: significant R knee pain, unable to perform full knee extension due to increased pain, formal MMT deferred LLE Deficits / Details: some L knee pain noted, not as significant as R knee. grossly 4/5    Cervical / Trunk Assessment Cervical / Trunk Assessment: Normal  Communication   Communication: No difficulties  Cognition Arousal/Alertness: Awake/alert Behavior During Therapy: WFL for tasks assessed/performed Overall Cognitive Status: Within Functional Limits for tasks assessed                                        General Comments      Exercises     Assessment/Plan    PT Assessment Patient needs continued PT services  PT Problem List Decreased strength;Decreased mobility;Decreased range of motion;Decreased activity tolerance;Decreased balance;Pain;Decreased knowledge of use of DME       PT Treatment Interventions DME instruction;Therapeutic exercise;Gait training;Balance training;Stair training;Neuromuscular re-education;Functional mobility training;Therapeutic activities;Patient/family education    PT Goals (Current goals can be found in the Care Plan section)  Acute Rehab PT Goals Patient Stated Goal: to decrease R knee pain PT Goal Formulation: With patient Time For Goal Achievement: 10/26/19 Potential to Achieve Goals: Good    Frequency Min 2X/week   Barriers to discharge        Co-evaluation               AM-PAC PT "6 Clicks" Mobility  Outcome Measure Help needed turning from your back to your side while  in a flat bed without using bedrails?: None Help needed moving from lying on your back to sitting on the side of a flat bed without using bedrails?: None Help needed moving to and from a bed to a chair (including a wheelchair)?: None Help needed standing up from a chair using your arms (e.g., wheelchair or bedside chair)?: None Help needed to walk in hospital room?: A Little Help needed climbing 3-5 steps with a railing? : A Little 6 Click Score: 22    End of Session Equipment Utilized During Treatment: Gait belt Activity Tolerance: Patient limited by pain Patient left: in chair;with call bell/phone within reach Nurse Communication: Mobility status(R knee pain and request for pain medication) PT Visit Diagnosis: Difficulty in walking, not elsewhere classified (R26.2);Other abnormalities of gait and mobility (R26.89);Pain Pain - Right/Left: Right Pain - part of body: Knee    Time: 5397-6734 PT Time Calculation (min) (ACUTE ONLY): 22 min   Charges:   PT Evaluation $PT Eval Low Complexity: 1 Low PT Treatments $Gait Training: 8-22 mins        Olga Coaster PT, DPT 9:45 AM,10/12/19

## 2019-10-13 ENCOUNTER — Telehealth: Payer: Self-pay | Admitting: Family Medicine

## 2019-10-13 LAB — BASIC METABOLIC PANEL
Anion gap: 9 (ref 5–15)
BUN: 21 mg/dL — ABNORMAL HIGH (ref 6–20)
CO2: 26 mmol/L (ref 22–32)
Calcium: 8.6 mg/dL — ABNORMAL LOW (ref 8.9–10.3)
Chloride: 105 mmol/L (ref 98–111)
Creatinine, Ser: 0.67 mg/dL (ref 0.61–1.24)
GFR calc Af Amer: >60 mL/min (ref >60)
GFR calc non Af Amer: >60 mL/min (ref >60)
Glucose, Bld: 250 mg/dL — ABNORMAL HIGH (ref 70–99)
Potassium: 3.7 mmol/L (ref 3.5–5.1)
Sodium: 140 mmol/L (ref 135–145)

## 2019-10-13 LAB — GLUCOSE, CAPILLARY
Glucose-Capillary: 237 mg/dL — ABNORMAL HIGH (ref 70–99)
Glucose-Capillary: 230 mg/dL — ABNORMAL HIGH (ref 70–99)

## 2019-10-13 LAB — MAGNESIUM: Magnesium: 1.7 mg/dL (ref 1.7–2.4)

## 2019-10-13 MED ORDER — NEPRO/CARBSTEADY PO LIQD: 237.0000 mL | Freq: Three times a day (TID) | ORAL | 0 refills | Status: AC

## 2019-10-13 MED ORDER — INSULIN STARTER KIT- PEN NEEDLES (ENGLISH): 1.0000 | Freq: Once | 0 refills | Status: AC

## 2019-10-13 MED ORDER — DICLOFENAC SODIUM 1 % EX GEL: 2.0000 g | Freq: Four times a day (QID) | CUTANEOUS | 1 refills | Status: AC

## 2019-10-13 MED ORDER — METFORMIN HCL 1000 MG PO TABS: 1000.0000 mg | ORAL_TABLET | Freq: Two times a day (BID) | ORAL | 0 refills | Status: AC

## 2019-10-13 MED ORDER — ACETAMINOPHEN 325 MG PO TABS: 650.0000 mg | ORAL_TABLET | Freq: Four times a day (QID) | ORAL | Status: AC | PRN

## 2019-10-13 MED ORDER — BLOOD GLUCOSE MONITOR KIT: PACK | 0 refills | Status: AC

## 2019-10-13 MED ORDER — INSULIN PEN NEEDLE 32G X 4 MM MISC: 0 refills | Status: AC

## 2019-10-13 MED ORDER — INSULIN LISPRO (1 UNIT DIAL) 100 UNIT/ML (KWIKPEN): 25.0000 [IU] | PEN_INJECTOR | Freq: Three times a day (TID) | SUBCUTANEOUS | 0 refills | Status: AC

## 2019-10-13 MED ORDER — TRAMADOL HCL 50 MG PO TABS: 50.0000 mg | ORAL_TABLET | Freq: Four times a day (QID) | ORAL | 0 refills | Status: AC | PRN

## 2019-10-13 MED ORDER — GABAPENTIN 300 MG PO CAPS: 300.0000 mg | ORAL_CAPSULE | Freq: Two times a day (BID) | ORAL | 0 refills | Status: AC

## 2019-10-13 MED ORDER — BASAGLAR KWIKPEN 100 UNIT/ML ~~LOC~~ SOPN: 70.0000 [IU] | PEN_INJECTOR | Freq: Two times a day (BID) | SUBCUTANEOUS | 0 refills | Status: AC

## 2019-10-13 MED ORDER — INSULIN GLARGINE 100 UNIT/ML ~~LOC~~ SOLN
70.0000 [IU] | Freq: Two times a day (BID) | SUBCUTANEOUS | Status: DC
  Filled 2019-10-13 (×2): qty 0.7

## 2019-10-13 NOTE — Telephone Encounter (Signed)
Copied from CRM [id number]. Topic: Appointment Scheduling - Scheduling Inquiry for Clinic >> Oct 05, 2019 10:16 AM Daphine Deutscher D wrote: Reason for CRM: Pt's girlfriend Renee Ramus (who is a patient of Dr. Ian Malkin)  called asking if Dr. Laural Benes would see her boyfriend as a new patient.  He was just released from [facility] Jan 3rd for pneumonia due to Covid.  He has not seen anyone since he has been out of the hospital.  He is still having some symptoms.  Thirsty, fatigued, blurred vision.  I told her Dr. Laural Benes was not taking new patients but she ask to ask her to see if she would make an exception.  CB#  [phone]

## 2019-10-13 NOTE — Progress Notes (Signed)
Inpatient Diabetes Program Recommendations  AACE/ADA: New Consensus Statement on Inpatient Glycemic Control (2015)  Target Ranges:  Prepandial:   less than 140 mg/dL      Peak postprandial:   less than 180 mg/dL (1-2 hours)      Critically ill patients:  140 - 180 mg/dL   Lab Results  Component Value Date   GLUCAP 237 (H) 10/13/2019   HGBA1C 6.5 (H) 09/07/2019    Review of Glycemic Control Results for Ian Pennington, Ian Pennington (MRN [id number]) as of 10/13/2019 09:10  Ref. Range 10/12/2019 07:54 10/12/2019 11:48 10/12/2019 16:20 10/12/2019 21:25 10/13/2019 07:53  Glucose-Capillary Latest Ref Range: 70 - 99 mg/dL 607 (H)  Novolog 40 units  Lantus 60  Metformin 500 mg  235 (H)  Novolog 32 units 179 (H)  Novolog 29 units     Metformin 500 mg  179 (H)  Novolog 4 units  Lantus 60 units 237 (H)  Novolog 32 units  Lantus 60 units  Metformin 1000 mg   Diabetes history: New DM diagnosis  Current orders for Inpatient glycemic control:  Lantus 60 units bid Novolog 0-20 units tid  Novolog 25 units tid meal coverage Metformin 1000 mg bid  Nepro tid between emals  Inpatient Diabetes Program Recommendations:    Pt received a total of 137 units of Novolog in the last 24 hours. Fasting glucose 237 this am. Consider increasing Lantus to 70 units bid.  Thanks,  Christena Deem RN, MSN, BC-ADM Inpatient Diabetes Coordinator Team Pager [phone] (8a-5p)

## 2019-10-13 NOTE — Plan of Care (Signed)
  Problem: Education: Goal: Ability to describe self-care measures that may prevent or decrease complications (Diabetes Survival Skills Education) will improve Outcome: Adequate for Discharge Goal: Individualized Educational Video(s) Outcome: Adequate for Discharge   Problem: Cardiac: Goal: Ability to maintain an adequate cardiac output will improve Outcome: Adequate for Discharge   Problem: Health Behavior/Discharge Planning: Goal: Ability to identify and utilize available resources and services will improve Outcome: Adequate for Discharge Goal: Ability to manage health-related needs will improve Outcome: Adequate for Discharge   Problem: Fluid Volume: Goal: Ability to achieve a balanced intake and output will improve Outcome: Adequate for Discharge   Problem: Metabolic: Goal: Ability to maintain appropriate glucose levels will improve Outcome: Adequate for Discharge   Problem: Nutritional: Goal: Maintenance of adequate nutrition will improve Outcome: Adequate for Discharge Goal: Maintenance of adequate weight for body size and type will improve Outcome: Adequate for Discharge   Problem: Respiratory: Goal: Will regain and/or maintain adequate ventilation Outcome: Adequate for Discharge   Problem: Urinary Elimination: Goal: Ability to achieve and maintain adequate renal perfusion and functioning will improve Outcome: Adequate for Discharge   

## 2019-10-13 NOTE — Telephone Encounter (Signed)
That's fine

## 2019-10-13 NOTE — Progress Notes (Signed)
Patient given discharge instructions. IV taken out and tele monitor off. Patient verbalized understanding without any questions or concerns. Patient's gf picking him up and patient knows where to pick up medications. Patient already has glucometer and supplies at bedside. Verbalizes understanding on how to use it.

## 2019-10-13 NOTE — Discharge Summary (Signed)
Physician Discharge Summary  Ian Pennington FMM:[id number] DOB: [birth year] DOA: 10/09/2019  PCP: Patient, No Pcp Per  Admit date: 10/09/2019 Discharge date: 10/13/2019  Admitted From: Home Disposition:  Home  Recommendations for Outpatient Follow-up:  1. Follow up with PCP in 1-2 weeks 2. Please obtain BMP/CBC in one week   Home Health: Yes Equipment/Devices: none  Discharge Condition: Stable  CODE STATUS: Full  Diet recommendation: Heart Healthy, Carb Modified  Brief/Interim Summary:  Ian Samples Harlowis a 54 y.o.malewith medical history significant ofhypertension, diabetes mellitus (A1c 6.5 on 09/07/2019), allergy, recent COVID-19 infection (12/26-1/2), who presents withprogressivepolyuria, polydipsia, blurry visionand generalized weakness x 3 weeks.He was admittedfrom 09/05/19-09/12/2019 due to COVID-19 infection, completed remdesivir and steroids.No prior diagnosis of diabetes but did require signficant insulin for glucose control, was not discharged on any insulin or meds, close PCP follow up was recommended. In the ED, found to have blood glucose of 1460 with normal anion gap, consistent with HHS. Patient was started on insulin drip and admitted to stepdown unit for further management.  Insulin regimen was started and titrated up for adequate control, targeting 140-180 for now.  Patient was seen and educated by diabetes coordinator regarding use of insulin.  Patient clinically improved and stable for discharge home on insulin for his diabetes, and with close PCP follow up.  Patient had a virtual PCP visit during admission as well.   Discharge Diagnoses: Principal Problem:   Type 2 diabetes mellitus with hyperosmolar hyperglycemic state (HHS) ([place]) Active Problems:   Pneumonia due to COVID-19 virus   HTN (hypertension)   AKI (acute kidney injury) ([place])  Type 2 diabetes mellitus with hyperosmolar hyperglycemic state (HHS) ([place]): Presented with BG1460, normal anion gap  with symptoms of Polyuria, polydipsia, weakness, blurry vision. A1C 6.5%on 09/07/19, but not on medications outpatient yet. Prior admission needed insulin while on steroids. Off insulin drip.Quite resistant to insulin, requiring increased dosing daily.  --Lantusto60units twice daily --Novolog 25 units TID WC + resistant sliding scale --metformin 1000 mg BID --medications sent to Medication Management on discharge  Blurry Vision - secondary to uncontrolled blood glucose. Discussed with ophthalmologist) likely this is lens swelling which will resolve over a couple of weeks OR macular edema which is best evaluated outpatient with OCT. Recommended outpatient followup.  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 Having radiation down to calf. No trauma or injury. --monitor & consider imaging if not improving or getting worse --pain control w Voltaren gel and PRN tramadol --PT evaluation  Recent pneumonia due to COVID-19 virus:Chest x-ray showed significant improvementof prior infiltrates. Patient still has mild cough and mild shortness of breath.Lungs are clear. -As needed bronchodilators, andprnMucinex  Hypertension -resume HCTZ -As needed hydralazine  AKI (acute kidney injury):Most likely due to prerenal secondary to dehydrationwith HHS --improved with IV hydration --monitor BMP     Discharge Instructions   Discharge Instructions    Call MD for:   Complete by: As directed    Low blood sugars (less than 80) or uncontrolled high blood sugars despite using insulin as prescribed.   Call MD for:  extreme fatigue   Complete by: As directed    Call MD for:  persistant nausea and vomiting   Complete by: As directed    Call MD for:  temperature >100.4   Complete by: As directed    Diet - low sodium heart healthy   Complete by: As directed    Discharge instructions   Complete by: As directed    Write down  all your blood sugar readings and bring to  Primary Care appointments.  Long-acting Insulin is called "glargine" (brand name Basaglar) - you take this AM and bedtime.  Short-acting Insulin is called "lispro" (brand name HumaLog) - you take this with meals, 25 units PLUS any extra units needed for sliding scale.  Below is the # of units of sliding scale you should add based on your pre-meal blood sugar reading.   Resistant Sliding Scale Insulin Dosing: 70-120: 0 units 121 - 150:  3 units 151 - 200: 4 units 201 - 250: 7 units 251 - 300: 11 units 301 - 350: 15 units 351 - 400: 20 units Over 400: 20 units and call doctor   Increase activity slowly   Complete by: As directed    Increase activity slowly   Complete by: As directed      Allergies as of 10/13/2019   No Known Allergies     Medication List    STOP taking these medications   ascorbic acid 500 MG tablet Commonly known as: VITAMIN C   zinc sulfate 220 (50 Zn) MG capsule     TAKE these medications   acetaminophen 325 MG tablet Commonly known as: TYLENOL Take 2 tablets (650 mg total) by mouth every 6 (six) hours as needed for mild pain or fever.   Basaglar KwikPen 100 UNIT/ML Sopn Inject 0.7 mLs (70 Units total) into the skin 2 (two) times daily.   blood glucose meter kit and supplies Kit Dispense based on patient and insurance preference. Use up to four times daily as directed. (FOR ICD-9 250.00, 250.01).   diclofenac Sodium 1 % Gel Commonly known as: VOLTAREN Apply 2 g topically 4 (four) times daily.   feeding supplement (NEPRO CARB STEADY) Liqd Take 237 mLs by mouth 3 (three) times daily between meals.   gabapentin 300 MG capsule Commonly known as: NEURONTIN Take 1 capsule (300 mg total) by mouth 2 (two) times daily.   hydrochlorothiazide 50 MG tablet Commonly known as: HYDRODIURIL Take 1 tablet (50 mg total) by mouth daily.   insulin lispro 100 UNIT/ML KwikPen Commonly known as: HumaLOG KwikPen Inject 0.25 mLs (25 Units total) into the skin 3  (three) times daily with meals.   Insulin Pen Needle 32G X 4 MM Misc Use 1 needle with each insulin injection, 5 times daily.   insulin starter kit- pen needles Misc 1 kit by Other route once for 1 dose.   metFORMIN 1000 MG tablet Commonly known as: GLUCOPHAGE Take 1 tablet (1,000 mg total) by mouth 2 (two) times daily with a meal.   multivitamin with minerals Tabs tablet Take 1 tablet by mouth daily.   traMADol 50 MG tablet Commonly known as: ULTRAM Take 1 tablet (50 mg total) by mouth every 6 (six) hours as needed for up to 5 days for moderate pain.      Follow-up [facility] Call in 1 day(s).   Why: Follow up as scheduled. Contact information: [place]. [place] [address] [phone]          No Known Allergies  Consultations:  none    Procedures/Studies: DG Chest Portable 1 View  Result Date: 10/09/2019 CLINICAL DATA:  Shortness breath EXAM: PORTABLE CHEST 1 VIEW COMPARISON:  Portable exam 1155 hours compared to 09/05/2019 FINDINGS: Enlargement of cardiac silhouette. Mediastinal contours and pulmonary vascularity normal. Improved airspace infiltrates, minimal residuals at RIGHT base and in LEFT upper lobe. No pleural effusion or pneumothorax.  IMPRESSION: Significantly improved pulmonary infiltrates. Electronically Signed   By: Lavonia Dana M.D.   On: 10/09/2019 12:05       Subjective: Patient seen this Am.  Says he feels good.  Vision unchanged.  Knee pain about the same, comes and goes like usual.  Denies fever/chills.  Says he feels comfortable using insulin at home.  No acute complaints.   Discharge Exam: Vitals:   10/13/19 0753 10/13/19 1143  BP: 113/73 121/72  Pulse: 88 (!) 117  Resp: 18 18  Temp: 97.7 F (36.5 C) 97.7 F (36.5 C)  SpO2: 96% 95%   Vitals:   10/12/19 2028 10/13/19 0336 10/13/19 0753 10/13/19 1143  BP: 120/74 106/61 113/73 121/72  Pulse:  86 88 (!) 117  Resp: _0 Temp: 98.3 F  (36.8 C) (!) 97.5 F (36.4 C) 97.7 F (36.5 C) 97.7 F (36.5 C)  TempSrc: Oral Oral  Oral  SpO2: 97% 98% 96% 95%  Weight:      Height:        General: Pt is alert, awake, not in acute distress Cardiovascular: RRR, S1/S2 +, no rubs, no gallops Respiratory: CTA bilaterally, no wheezing, no rhonchi Abdominal: Soft, NT, ND, bowel sounds + Extremities: no edema, no cyanosis    The results of significant diagnostics from this hospitalization (including imaging, microbiology, ancillary and laboratory) are listed below for reference.     Microbiology: Recent Results (from the past 240 hour(s))  MRSA PCR Screening     Status: None   Collection Time: 10/09/19  6:46 PM   Specimen: Nasopharyngeal  Result Value Ref Range Status   MRSA by PCR NEGATIVE NEGATIVE Final    Comment:        The GeneXpert MRSA Assay (FDA approved for NASAL specimens only), is one component of a comprehensive MRSA colonization surveillance program. It is not intended to diagnose MRSA infection nor to guide or monitor treatment for MRSA infections. Performed at [facility], [address] [place], [place] [address]      Labs: BNP (last 3 results) No results for input(s): BNP in the last 8760 hours. Basic Metabolic Panel: Recent Labs  Lab 10/10/19 0621 10/10/19 1031 10/11/19 0627 10/12/19 0533 10/13/19 0626  NA 147* 140 138 138 140  K 4.5 3.7 4.4 3.9 3.7  CL 110 107 107 102 105  CO2 _1 GLUCOSE 248* 319* 368* 306* 250*  BUN _2 21*  CREATININE 0.88 0.90 0.87 0.75 0.67  CALCIUM 8.6* 8.3* 8.2* 8.5* 8.6*  MG 2.0  --  2.0 1.8 1.7  PHOS 2.3*  --   --   --   --    Liver Function Tests: Recent Labs  Lab 10/09/19 1204  AST 55*  ALT 102*  ALKPHOS 61  BILITOT 1.4*  PROT 5.7*  ALBUMIN 3.1*   Recent Labs  Lab 10/10/19 0621  LIPASE 75*   No results for input(s): AMMONIA in the last 168 hours. CBC: Recent Labs  Lab 10/09/19 1204 10/11/19 0627  WBC 7.3  5.3  HGB 14.4 12.7*  HCT 45.7 37.7*  MCV 95.8 90.6  PLT 336 225   Cardiac Enzymes: No results for input(s): CKTOTAL, CKMB, CKMBINDEX, TROPONINI in the last 168 hours. BNP: Invalid input(s): POCBNP CBG: Recent Labs  Lab 10/12/19 1148 10/12/19 1620 10/12/19 2125 10/13/19 0753 10/13/19 1143  GLUCAP 235* 179* 179* 237* 230*   D-Dimer No results for input(s): DDIMER in the  last 72 hours. Hgb A1c No results for input(s): HGBA1C in the last 72 hours. Lipid Profile No results for input(s): CHOL, HDL, LDLCALC, TRIG, CHOLHDL, LDLDIRECT in the last 72 hours. Thyroid function studies No results for input(s): TSH, T4TOTAL, T3FREE, THYROIDAB in the last 72 hours.  Invalid input(s): FREET3 Anemia work up No results for input(s): VITAMINB12, FOLATE, FERRITIN, TIBC, IRON, RETICCTPCT in the last 72 hours. Urinalysis    Component Value Date/Time   COLORURINE STRAW (A) 10/09/2019 1204   APPEARANCEUR CLEAR (A) 10/09/2019 1204   LABSPEC 1.029 10/09/2019 1204   PHURINE 5.0 10/09/2019 1204   GLUCOSEU >=500 (A) 10/09/2019 1204   HGBUR NEGATIVE 10/09/2019 [place] 10/09/2019 1204   KETONESUR 5 (A) 10/09/2019 1204   PROTEINUR NEGATIVE 10/09/2019 1204   NITRITE NEGATIVE 10/09/2019 1204   LEUKOCYTESUR NEGATIVE 10/09/2019 1204   Sepsis Labs Invalid input(s): PROCALCITONIN,  WBC,  LACTICIDVEN Microbiology Recent Results (from the past 240 hour(s))  MRSA PCR Screening     Status: None   Collection Time: 10/09/19  6:46 PM   Specimen: Nasopharyngeal  Result Value Ref Range Status   MRSA by PCR NEGATIVE NEGATIVE Final    Comment:        The GeneXpert MRSA Assay (FDA approved for NASAL specimens only), is one component of a comprehensive MRSA colonization surveillance program. It is not intended to diagnose MRSA infection nor to guide or monitor treatment for MRSA infections. Performed at [facility], [address] [place], [place] [address]       Time coordinating discharge: Over 30 minutes  SIGNED:   Ezekiel Slocumb, DO [facility] 10/13/2019, 1:14 PM   If 7PM-7AM, please contact night-coverage [email or web address]

## 2019-10-14 NOTE — Telephone Encounter (Signed)
Called kimberly back to work on scheduling, no answer, left vm

## 2019-10-19 NOTE — Telephone Encounter (Signed)
LVM for pt to call back.

## 2019-12-04 ENCOUNTER — Ambulatory Visit: Payer: Medicaid Other | Admitting: Pharmacy Technician

## 2019-12-04 ENCOUNTER — Other Ambulatory Visit: Payer: Self-pay

## 2019-12-04 DIAGNOSIS — Z79899 Other long term (current) drug therapy: Secondary | ICD-10-CM

## 2019-12-04 NOTE — Progress Notes (Signed)
Patient stated that he has Medicaid with prescription drug coverage.  Explained that he no longer meets [facility]'s eligibility criteria.  Patient stated that he is seeing a provider at [facility] and is getting medications from [facility] as well.  Sherilyn Dacosta Care Manager [facility]

## 2021-10-12 IMAGING — DX DG CHEST 1V PORT
1 series · 1 of 1 positions shown · non-contrast
Comparison: Portable exam 8811 hours compared to 09/05/2019

CLINICAL DATA: Shortness breath

EXAM:
PORTABLE CHEST 1 VIEW

[chest ap]
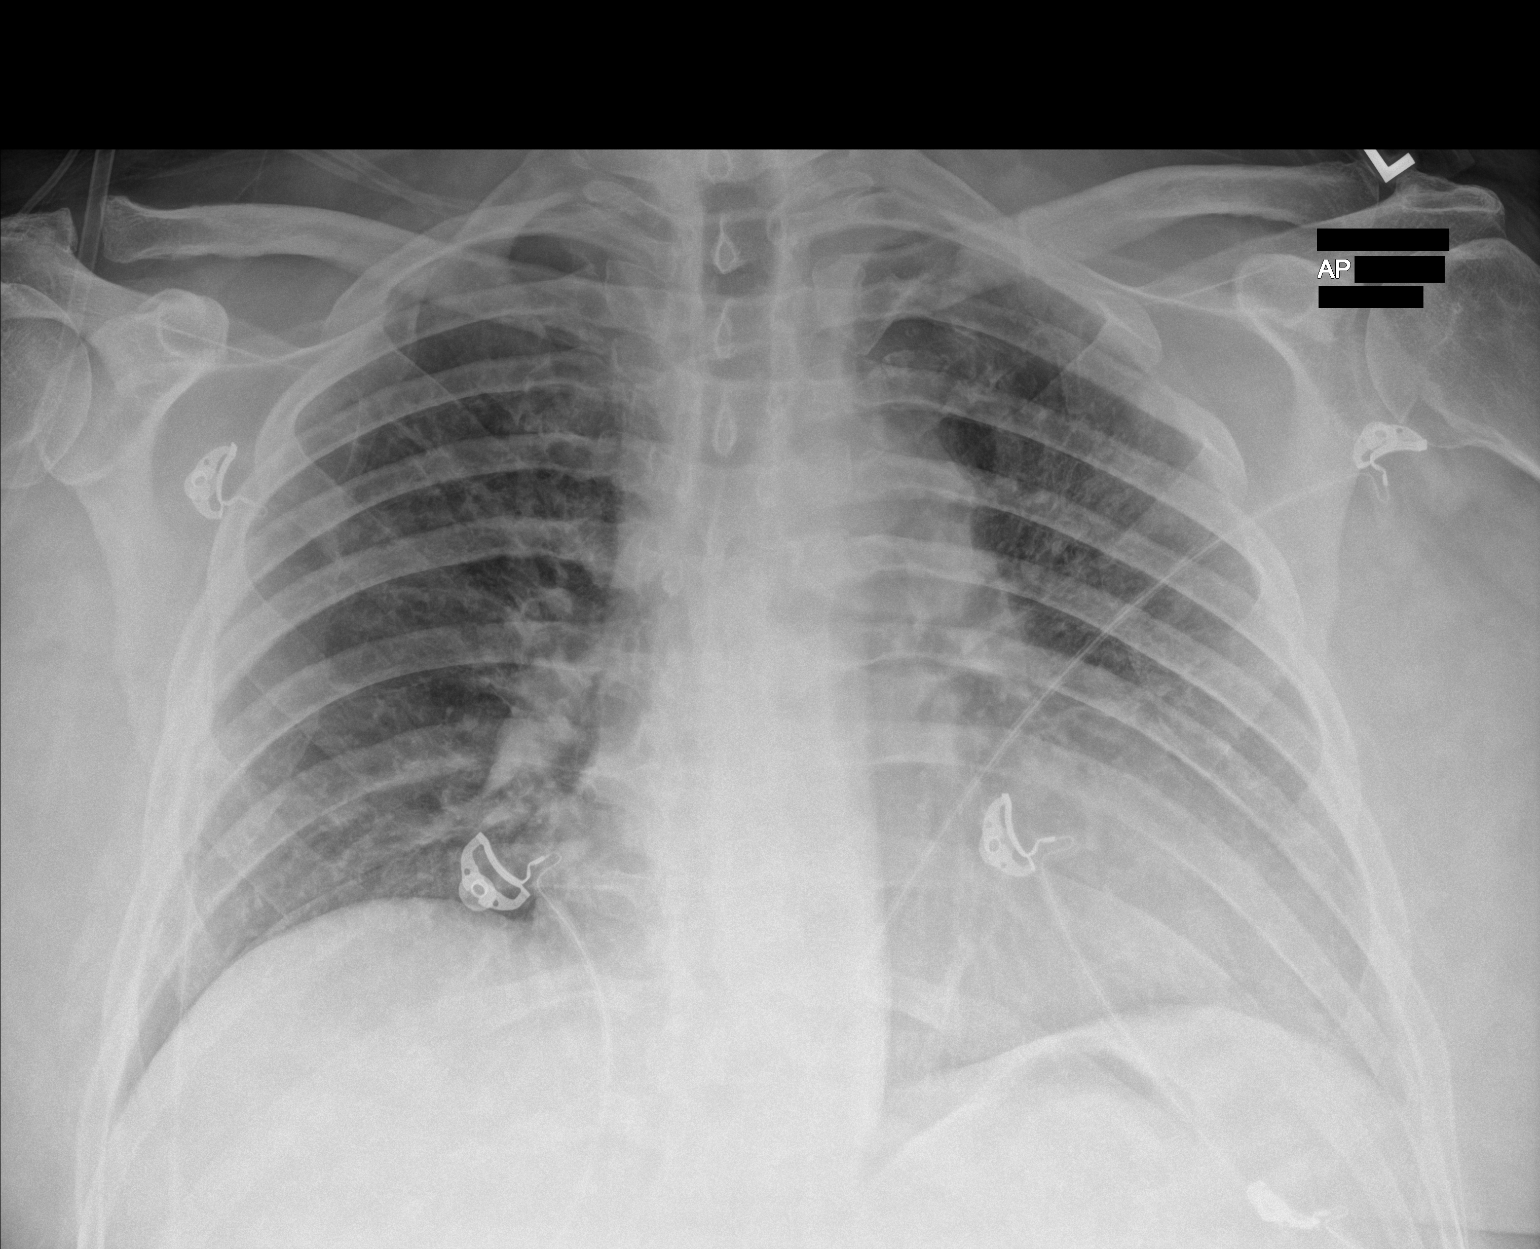

[1 of 1 positions shown; findings below may reference images not displayed]

FINDINGS: Enlargement of cardiac silhouette.

Mediastinal contours and pulmonary vascularity normal.

Improved airspace infiltrates, minimal residuals at RIGHT base and
in LEFT upper lobe.

No pleural effusion or pneumothorax.
IMPRESSION: Significantly improved pulmonary infiltrates.

## 2023-09-10 ENCOUNTER — Emergency Department: Payer: Medicaid Other

## 2023-09-10 ENCOUNTER — Other Ambulatory Visit: Payer: Self-pay

## 2023-09-10 ENCOUNTER — Emergency Department
Admission: EM | Admit: 2023-09-10 | Discharge: 2023-09-10 | Disposition: A | Discharge status: Home / Self Care | Payer: Medicaid Other | Attending: Emergency Medicine | Admitting: Emergency Medicine

## 2023-09-10 DIAGNOSIS — J9811 Atelectasis: Secondary | ICD-10-CM | POA: Diagnosis not present

## 2023-09-10 DIAGNOSIS — R0789 Other chest pain: Secondary | ICD-10-CM | POA: Diagnosis not present

## 2023-09-10 DIAGNOSIS — R059 Cough, unspecified: Secondary | ICD-10-CM | POA: Diagnosis not present

## 2023-09-10 DIAGNOSIS — U071 COVID-19: Secondary | ICD-10-CM | POA: Insufficient documentation

## 2023-09-10 LAB — CBC WITH DIFFERENTIAL/PLATELET
Abs Immature Granulocytes: 0.03 10*3/uL (ref 0.00–0.07)
Basophils Absolute: 0.1 10*3/uL (ref 0.0–0.1)
Basophils Relative: 1 %
Eosinophils Absolute: 0.1 10*3/uL (ref 0.0–0.5)
Eosinophils Relative: 1 %
HCT: 45.6 % (ref 39.0–52.0)
Hemoglobin: 16.2 g/dL (ref 13.0–17.0)
Immature Granulocytes: 0 %
Lymphocytes Relative: 13 %
Lymphs Abs: 1.4 10*3/uL (ref 0.7–4.0)
MCH: 32.2 pg (ref 26.0–34.0)
MCHC: 35.5 g/dL (ref 30.0–36.0)
MCV: 90.7 fL (ref 80.0–100.0)
Monocytes Absolute: 0.7 10*3/uL (ref 0.1–1.0)
Monocytes Relative: 7 %
Neutro Abs: 8.5 10*3/uL — ABNORMAL HIGH (ref 1.7–7.7)
Neutrophils Relative %: 78 %
Platelets: 191 10*3/uL (ref 150–400)
RBC: 5.03 MIL/uL (ref 4.22–5.81)
RDW: 12.3 % (ref 11.5–15.5)
WBC: 10.8 10*3/uL — ABNORMAL HIGH (ref 4.0–10.5)
nRBC: 0 % (ref 0.0–0.2)

## 2023-09-10 LAB — BASIC METABOLIC PANEL
Anion gap: 13 (ref 5–15)
BUN: 19 mg/dL (ref 6–20)
CO2: 20 mmol/L — ABNORMAL LOW (ref 22–32)
Calcium: 8.7 mg/dL — ABNORMAL LOW (ref 8.9–10.3)
Chloride: 103 mmol/L (ref 98–111)
Creatinine, Ser: 0.87 mg/dL (ref 0.61–1.24)
GFR, Estimated: >60 mL/min (ref >60)
Glucose, Bld: 200 mg/dL — ABNORMAL HIGH (ref 70–99)
Potassium: 3.7 mmol/L (ref 3.5–5.1)
Sodium: 136 mmol/L (ref 135–145)

## 2023-09-10 LAB — TROPONIN I (HIGH SENSITIVITY): Troponin I (High Sensitivity): 4 ng/L (ref <18)

## 2023-09-10 MED ORDER — BENZONATATE 100 MG PO CAPS: 100.0000 mg | ORAL_CAPSULE | Freq: Three times a day (TID) | ORAL | 0 refills | Status: AC | PRN

## 2023-09-10 MED ORDER — ONDANSETRON 4 MG PO TBDP: 4.0000 mg | ORAL_TABLET | Freq: Three times a day (TID) | ORAL | 0 refills | Status: AC | PRN

## 2023-09-10 MED ORDER — NIRMATRELVIR/RITONAVIR (PAXLOVID)TABLET: 3.0000 | ORAL_TABLET | Freq: Two times a day (BID) | ORAL | 0 refills | Status: AC

## 2023-09-10 MED ORDER — AZITHROMYCIN 250 MG PO TABS: ORAL_TABLET | ORAL | 0 refills | Status: AC

## 2023-09-10 NOTE — ED Triage Notes (Signed)
Pt to ED for testing positive for covid today, sx started Sunday. Reports having chest pain. States concerned because almost died last time.

## 2023-09-10 NOTE — ED Provider Triage Note (Signed)
 Emergency Medicine Provider Triage Evaluation Note  Ian Pennington , a 57 y.o. male  was evaluated in triage.  Pt complains of COVID, tested positive today. Symptoms began 2-3 days ago. Worsened yesterday. Reports he was hospitalized with covid 4 years ago and is concerned. Got 2 vaccines, then no more covid vaccinations.  Review of Systems  Positive: cough Negative: fever  Physical Exam  There were no vitals taken for this visit. Gen:   Awake, no distress   Resp:  Normal effort  MSK:   Moves extremities without difficulty  Other:    Medical Decision Making  Medically screening exam initiated at 9:35 AM.  Appropriate orders placed.  Ian Pennington was informed that the remainder of the evaluation will be completed by another provider, this initial triage assessment does not replace that evaluation, and the importance of remaining in the ED until their evaluation is complete.     Ian Pennington E, PA-C 09/10/23 [id number]

## 2023-09-10 NOTE — ED Provider Notes (Addendum)
 [facility] Provider Note    None    (approximate)   History   Chest Pain and Covid Positive   HPI  Ian Pennington is a 57 y.o. male who is known COVID-positive who comes in with concerns for being COVID-positive.  Patient reports that he was significantly sick with COVID 4 years ago.  He reports having 2 vaccines.  He reports symptoms started about 2 or 3 days ago cough.  Reports he is no longer on medications for diabetes but given his prior history of significant illness he want to come in early to be evaluated.  He reports having mostly a cough, chest pain with coughing.  Chest pain happened last night.  He denies any pain at this time.  Physical Exam   Triage Vital Signs: ED Triage Vitals  Encounter Vitals Group     BP 09/10/23 0936 (!) 176/88     Systolic BP Percentile --      Diastolic BP Percentile --      Pulse Rate 09/10/23 0936 (!) 108     Resp 09/10/23 0936 20     Temp 09/10/23 0936 99.4 F (37.4 C)     Temp Source 09/10/23 0936 Oral     SpO2 09/10/23 0936 95 %     Weight 09/10/23 0940 (!) 315 lb (142.9 kg)     Height 09/10/23 0940 6' 4 (1.93 m)     Head Circumference --      Peak Flow --      Pain Score 09/10/23 0939 7     Pain Loc --      Pain Education --      Exclude from Growth Chart --     Most recent vital signs: Vitals:   09/10/23 0936  BP: (!) 176/88  Pulse: (!) 108  Resp: 20  Temp: 99.4 F (37.4 C)  SpO2: 95%     General: Awake, no distress.  CV:  Good peripheral perfusion.  Resp:  Normal effort.  Clear lungs Abd:  No distention.  Other:  No swelling in legs.  No calf tenderness   ED Results / Procedures / Treatments   Labs (all labs ordered are listed, but only abnormal results are displayed) Labs Reviewed  BASIC METABOLIC PANEL - Abnormal; Notable for the following components:      Result Value   CO2 20 (*)    Glucose, Bld 200 (*)    Calcium 8.7 (*)    All other components within normal limits   CBC WITH DIFFERENTIAL/PLATELET - Abnormal; Notable for the following components:   WBC 10.8 (*)    Neutro Abs 8.5 (*)    All other components within normal limits  TROPONIN I (HIGH SENSITIVITY)  TROPONIN I (HIGH SENSITIVITY)     EKG  My interpretation of EKG:  Sinus tachycardia rate of 102 no ST elevation, T wave inversions, normal normals.  His EKG is unchanged from 2021 with similar T wave inversions.  RADIOLOGY I have reviewed the xray personally and interpreted and some atelectasis noted   PROCEDURES:  Critical Care performed: No  Procedures   MEDICATIONS ORDERED IN ED: Medications - No data to display   IMPRESSION / MDM / ASSESSMENT AND PLAN / ED COURSE  I reviewed the triage vital signs and the nursing notes.   Patient's presentation is most consistent with acute presentation with potential threat to life or bodily function.   Seen in triage room given no available beds.  Patient and family member did expressed comfort in doing so.  They understand that they have the option to continue waiting for a available room they have opted to want to be seen in triage.  Differential is COVID, pneumonia, Electra abnormalities, AKI.  Patient had previous steroids that put him into significant hyperglycemia therefore we will avoid any steroids.  Chest x-ray with some atelectasis no obvious pneumonia will cover with some azithromycin .  We discussed Paxlovid  with patient given his risk factors of obesity, prior severe symptoms and borderline diabetes he and he would like to proceed.  Patient was slightly tachycardic but on EKG his heart rate was 102.  We discussed oral hydration given he can tolerate p.o. given IV shortage.  Patient expressed understanding and felt comfortable with this plan.  We also discussed Tylenol  for fevers.  We also prescribed some symptomatic treatment with Tessalon  Perles, Zofran .  No indication for second troponin given his EKG is unchanged and chest pain  occurred overnight.   The patient is on the cardiac monitor to evaluate for evidence of arrhythmia and/or significant heart rate changes.      FINAL CLINICAL IMPRESSION(S) / ED DIAGNOSES   Final diagnoses:  COVID-19     Rx / DC Orders   ED Discharge Orders     None        Note:  This document was prepared using Dragon voice recognition software and may include unintentional dictation errors.   Ernest Ronal BRAVO, MD 09/10/23 1231    Ernest Ronal BRAVO, MD 09/10/23 [phone]

## 2023-09-10 NOTE — Discharge Instructions (Signed)
 Take Tylenol  1 g every 8 hours with ibuprofen 600 every 8 hours as needed. Take the Paxlovid  to try to decrease the risk for severe COVID. Azithromycin  can help with inflammation but no obvious pneumonia at this time. Holding on steroids due to your history of elevated sugars.  Your sugar today is 200 this needs to be followed up with your PCP  Return to the ER if develop worsening symptoms or any other concerns

## 2024-03-07 ENCOUNTER — Emergency Department

## 2024-03-07 ENCOUNTER — Emergency Department
Admission: EM | Admit: 2024-03-07 | Discharge: 2024-03-07 | Disposition: A | Discharge status: Home / Self Care | Attending: Emergency Medicine | Admitting: Emergency Medicine

## 2024-03-07 ENCOUNTER — Other Ambulatory Visit: Payer: Self-pay

## 2024-03-07 DIAGNOSIS — H6501 Acute serous otitis media, right ear: Secondary | ICD-10-CM | POA: Diagnosis not present

## 2024-03-07 DIAGNOSIS — J069 Acute upper respiratory infection, unspecified: Secondary | ICD-10-CM | POA: Diagnosis not present

## 2024-03-07 DIAGNOSIS — I10 Essential (primary) hypertension: Secondary | ICD-10-CM | POA: Insufficient documentation

## 2024-03-07 DIAGNOSIS — E119 Type 2 diabetes mellitus without complications: Secondary | ICD-10-CM | POA: Insufficient documentation

## 2024-03-07 DIAGNOSIS — I517 Cardiomegaly: Secondary | ICD-10-CM | POA: Diagnosis not present

## 2024-03-07 DIAGNOSIS — H9201 Otalgia, right ear: Secondary | ICD-10-CM | POA: Diagnosis present

## 2024-03-07 DIAGNOSIS — R059 Cough, unspecified: Secondary | ICD-10-CM | POA: Diagnosis not present

## 2024-03-07 DIAGNOSIS — Z8616 Personal history of COVID-19: Secondary | ICD-10-CM | POA: Diagnosis not present

## 2024-03-07 LAB — RESP PANEL BY RT-PCR (RSV, FLU A&B, COVID)  RVPGX2
Influenza A by PCR: NEGATIVE
Influenza B by PCR: NEGATIVE
Resp Syncytial Virus by PCR: NEGATIVE
SARS Coronavirus 2 by RT PCR: NEGATIVE

## 2024-03-07 LAB — GROUP A STREP BY PCR: Group A Strep by PCR: NOT DETECTED

## 2024-03-07 MED ORDER — ACETAMINOPHEN 325 MG PO TABS
650.0000 mg | ORAL_TABLET | Freq: Once | ORAL | Status: AC
  Administered 2024-03-07: 650 mg via ORAL
  Filled 2024-03-07: qty 2

## 2024-03-07 MED ORDER — AMOXICILLIN 875 MG PO TABS: 875.0000 mg | ORAL_TABLET | Freq: Two times a day (BID) | ORAL | 0 refills | Status: AC

## 2024-03-07 MED ORDER — KETOROLAC TROMETHAMINE 30 MG/ML IJ SOLN
30.0000 mg | Freq: Once | INTRAMUSCULAR | Status: AC
  Administered 2024-03-07: 30 mg via INTRAMUSCULAR
  Filled 2024-03-07: qty 1

## 2024-03-07 NOTE — ED Provider Notes (Signed)
 [facility] Emergency Department Provider Note     Event Date/Time   First MD Initiated Contact with Patient 03/07/24 1553     (approximate)   History   Otalgia   HPI  Ian Pennington is a 58 y.o. male with a PMHx of HTN and DM presents to the ED with complaint of right ear pain x 1 day. States feeling fluid trapped in his right ear. Patient reports he has had respiratory issues x 1 week including productive coughing of green sputum, hot and cold sweats. Associated symptoms includes sore throat. Hx of collapsed lung following hospital admission in 2020 due to (+) COVID.     Physical Exam   Triage Vital Signs: ED Triage Vitals  Encounter Vitals Group     BP 03/07/24 1435 (!) 140/117     Girls Systolic BP Percentile --      Girls Diastolic BP Percentile --      Boys Systolic BP Percentile --      Boys Diastolic BP Percentile --      Pulse Rate 03/07/24 1435 (!) 102     Resp 03/07/24 1435 16     Temp 03/07/24 1435 98.4 F (36.9 C)     Temp Source 03/07/24 1435 Oral     SpO2 03/07/24 1435 94 %     Weight 03/07/24 1436 (!) 320 lb (145.2 kg)     Height 03/07/24 1436 6' 4 (1.93 m)     Head Circumference --      Peak Flow --      Pain Score 03/07/24 1436 10     Pain Loc --      Pain Education --      Exclude from Growth Chart --     Most recent vital signs: Vitals:   03/07/24 1435  BP: (!) 140/117  Pulse: (!) 102  Resp: 16  Temp: 98.4 F (36.9 C)  SpO2: 94%    General: Obvious discomfort. Alert and oriented. INAD.    Head:  NCAT.  Eyes:  PERRLA. EOMI.  Ears:  EACs patent. RTM is erythematous and bulging. Left Tympanic membranes clear.  Throat: Moderate erythematous oropharynx. Uvula midline.  CV:  Good peripheral perfusion. RRR.  RESP:  Normal effort. LCTAB.   ED Results / Procedures / Treatments   Labs (all labs ordered are listed, but only abnormal results are displayed) Labs Reviewed  RESP PANEL BY RT-PCR (RSV, FLU A&B,  COVID)  [id number]  GROUP A STREP BY PCR   RADIOLOGY  I personally viewed and evaluated these images as part of my medical decision making, as well as reviewing the written report by the radiologist.  ED Provider Interpretation: No focal consolidation.  DG Chest 1 View Result Date: 03/07/2024 CLINICAL DATA:  cough EXAM: CHEST  1 VIEW COMPARISON:  None available. FINDINGS: No focal airspace consolidation, pleural effusion, or pneumothorax. Mild cardiomegaly.No acute fracture or destructive lesion. Multilevel thoracic osteophytosis. IMPRESSION: No acute cardiopulmonary abnormality. Electronically Signed   By: Rogelia Myers M.D.   On: 03/07/2024 16:13    PROCEDURES:  Critical Care performed: No  Procedures   MEDICATIONS ORDERED IN ED: Medications  ketorolac  (TORADOL ) 30 MG/ML injection 30 mg (30 mg Intramuscular Given 03/07/24 1609)  acetaminophen  (TYLENOL ) tablet 650 mg (650 mg Oral Given 03/07/24 1609)     IMPRESSION / MDM / ASSESSMENT AND PLAN / ED COURSE  I reviewed the triage vital signs and the nursing notes.  58 y.o. male presents to the emergency department for evaluation and treatment of Acute right ear pain. See HPI for further details.   Differential diagnosis includes, but is not limited to viral URI, otits media, PNA   Patient's presentation is most consistent with acute complicated illness / injury requiring diagnostic workup.  Will obtain resp panel, rapid strep and chex xray.   ----------------------------------------- 5:02 PM on 03/07/2024 -----------------------------------------  Patient is alert and oriented.  He is hemodynamically stable and afebrile.  Physical exam findings are pertinent for right tympanic membrane erythema and bulging.  ED pain management with IM Toradol .  Respiratory panel and rapid strep test is negative.  Chest x-ray does not reveal any focal consolidation indicating PNA.  Will send patient home with  prescription for amoxicillin  for otitis media.  Encouraged follow-up with PCP in 1 week.  ED return precaution discussed.   FINAL CLINICAL IMPRESSION(S) / ED DIAGNOSES   Final diagnoses:  Non-recurrent acute serous otitis media of right ear  Upper respiratory tract infection, unspecified type    Rx / DC Orders   ED Discharge Orders          Ordered    amoxicillin  (AMOXIL ) 875 MG tablet  2 times daily        03/07/24 1645            Note:  This document was prepared using Dragon voice recognition software and may include unintentional dictation errors.    Margrette, Sabena Winner A, PA-C 03/07/24 1703    Waymond Lorelle Cummins, MD 03/07/24 [phone]

## 2024-03-07 NOTE — Discharge Instructions (Signed)
 You were seen in the emergency department today for a cough. Your respiratory panel which includes COVID, influenza and RSV were negative.  Your rapid strep test is negative.  Your ear exam is consistent with an inner ear infection of the right ear.  You will be sent antibiotics to your pharmacy which you are encouraged to complete dosage.  A viral cough may last up to 2-3 weeks.   Take tylenol  or ibuprofen for pain or fever as directed.   Stay hydrated by drinking plenty of fluids to thin mucus. Get adequate amount of sleep and avoid overexertion. Consider a humidifier at night. Warm teas and a spoonful of honey may help reduce cough frequency. Follow up with your primary care provider as needed.   for your sore throat use throat lozenges or Chloraseptic spray.  Gargle with warm salt water several times daily  For your ear pain use a saline nasal spray or decongestant to reduce nasal swelling and pressure. Keep the ear dry and avoid swimming or submerging the ear in water. Apply a warm compress to the affected ear for 15-20 minutes. Avoid inserting anything into the ear, including cotton swabs.

## 2024-03-07 NOTE — ED Triage Notes (Signed)
 Pt reports right ear pain since yesterday. Pt reports constantly ringing today. Pt talks in complete sentences no respiratory distress noted

## 2024-03-07 NOTE — ED Notes (Signed)
 Pt has had URI for 5 days and R otalgia since yesterday.

## 2024-03-16 DIAGNOSIS — Z0131 Encounter for examination of blood pressure with abnormal findings: Secondary | ICD-10-CM | POA: Diagnosis not present

## 2024-03-16 DIAGNOSIS — H6691 Otitis media, unspecified, right ear: Secondary | ICD-10-CM | POA: Diagnosis not present

## 2024-03-16 DIAGNOSIS — Z1389 Encounter for screening for other disorder: Secondary | ICD-10-CM | POA: Diagnosis not present

## 2024-07-22 ENCOUNTER — Other Ambulatory Visit: Payer: Self-pay

## 2024-07-22 ENCOUNTER — Emergency Department

## 2024-07-22 ENCOUNTER — Emergency Department
Admission: EM | Admit: 2024-07-22 | Discharge: 2024-07-22 | Disposition: A | Discharge status: Home / Self Care | Attending: Emergency Medicine | Admitting: Emergency Medicine

## 2024-07-22 DIAGNOSIS — R0789 Other chest pain: Secondary | ICD-10-CM | POA: Diagnosis not present

## 2024-07-22 DIAGNOSIS — R079 Chest pain, unspecified: Secondary | ICD-10-CM

## 2024-07-22 DIAGNOSIS — R0602 Shortness of breath: Secondary | ICD-10-CM | POA: Diagnosis not present

## 2024-07-22 DIAGNOSIS — I319 Disease of pericardium, unspecified: Secondary | ICD-10-CM | POA: Diagnosis not present

## 2024-07-22 DIAGNOSIS — I700 Atherosclerosis of aorta: Secondary | ICD-10-CM | POA: Diagnosis not present

## 2024-07-22 DIAGNOSIS — I309 Acute pericarditis, unspecified: Secondary | ICD-10-CM | POA: Insufficient documentation

## 2024-07-22 DIAGNOSIS — I771 Stricture of artery: Secondary | ICD-10-CM | POA: Diagnosis not present

## 2024-07-22 HISTORY — DX: Pneumothorax, unspecified: J93.9

## 2024-07-22 LAB — CBC
HCT: 45.1 % (ref 39.0–52.0)
Hemoglobin: 15.9 g/dL (ref 13.0–17.0)
MCH: 32.1 pg (ref 26.0–34.0)
MCHC: 35.3 g/dL (ref 30.0–36.0)
MCV: 91.1 fL (ref 80.0–100.0)
Platelets: 206 K/uL (ref 150–400)
RBC: 4.95 MIL/uL (ref 4.22–5.81)
RDW: 12.2 % (ref 11.5–15.5)
WBC: 6.5 K/uL (ref 4.0–10.5)
nRBC: 0 % (ref 0.0–0.2)

## 2024-07-22 LAB — TROPONIN T, HIGH SENSITIVITY: Troponin T High Sensitivity: <15 ng/L (ref 0–19)

## 2024-07-22 LAB — BASIC METABOLIC PANEL WITH GFR
Anion gap: 12 (ref 5–15)
BUN: 15 mg/dL (ref 6–20)
CO2: 24 mmol/L (ref 22–32)
Calcium: 9.2 mg/dL (ref 8.9–10.3)
Chloride: 105 mmol/L (ref 98–111)
Creatinine, Ser: 0.9 mg/dL (ref 0.61–1.24)
GFR, Estimated: >60 mL/min (ref >60)
Glucose, Bld: 137 mg/dL — ABNORMAL HIGH (ref 70–99)
Potassium: 3.9 mmol/L (ref 3.5–5.1)
Sodium: 141 mmol/L (ref 135–145)

## 2024-07-22 MED ORDER — INDOMETHACIN 50 MG PO CAPS: 50.0000 mg | ORAL_CAPSULE | Freq: Three times a day (TID) | ORAL | 0 refills | Status: AC

## 2024-07-22 NOTE — ED Provider Notes (Signed)
 [facility] Provider Note   Event Date/Time   First MD Initiated Contact with Patient 07/22/24 1308     (approximate) History  Chest Pain  HPI Ian Pennington is a 58 y.o. 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  Patient describes a heaviness/sharp pain on the left side of the chest that is worse when laying flat and resolves when sitting forward.  Patient states that this pain feels similar to pericarditis episodes that he has had when he was younger.  Patient denies any other exacerbating or relieving factors for this pain including no exertional worsening.  Patient states that this pain does not change based on his food intake. ROS: Patient currently denies any vision changes, tinnitus, difficulty speaking, facial droop, sore throat, shortness of breath, abdominal pain, nausea/vomiting/diarrhea, dysuria, or weakness/numbness/paresthesias in any extremity   Physical Exam  Triage Vital Signs: ED Triage Vitals  Encounter Vitals Group     BP 07/22/24 1237 (!) 144/82     Girls Systolic BP Percentile --      Girls Diastolic BP Percentile --      Boys Systolic BP Percentile --      Boys Diastolic BP Percentile --      Pulse Rate 07/22/24 1237 68     Resp 07/22/24 1237 20     Temp 07/22/24 1237 98.1 F (36.7 C)     Temp Source 07/22/24 1237 Oral     SpO2 07/22/24 1237 97 %     Weight 07/22/24 1236 (!) 320 lb (145.2 kg)     Height 07/22/24 1236 6' 4 (1.93 m)     Head Circumference --      Peak Flow --      Pain Score 07/22/24 1236 9     Pain Loc --      Pain Education --      Exclude from Growth Chart --    Most recent vital signs: Vitals:   07/22/24 1330 07/22/24 1400  BP: (!) 144/74 133/83  Pulse: (!) 55 (!) 55  Resp:  17  Temp:  98.1 F (36.7 C)  SpO2: 98% 96%   General: Awake, oriented  x4. CV:  Good peripheral perfusion.  Bedside echocardiogram shows small pericardial effusion Resp:  Normal effort. Abd:  No distention. Other:  Middle-aged obese Caucasian male resting comfortably in no acute distress ED Results / Procedures / Treatments  Labs (all labs ordered are listed, but only abnormal results are displayed) Labs Reviewed  BASIC METABOLIC PANEL WITH GFR - Abnormal; Notable for the following components:      Result Value   Glucose, Bld 137 (*)    All other components within normal limits  CBC  TROPONIN T, HIGH SENSITIVITY  TROPONIN T, HIGH SENSITIVITY   EKG ED ECG REPORT I, Artist MARLA Kerns, the attending physician, personally viewed and interpreted this ECG. Date: 07/22/2024 EKG Time: 1239 Rate: 58 Rhythm: normal sinus rhythm QRS Axis: normal Intervals: normal ST/T Wave abnormalities: normal Narrative Interpretation: no evidence of acute ischemia RADIOLOGY ED MD interpretation: 2 view chest x-ray interpreted by me shows no evidence of acute abnormalities including no pneumonia, pneumothorax, or widened mediastinum - All radiology independently interpreted and agree with radiology assessment Official radiology report(s): DG Chest 2 View Result Date: 07/22/2024 EXAM: 2 VIEW(S) XRAY OF THE CHEST 07/22/2024 01:26:00 PM COMPARISON: 03/07/2024 CLINICAL HISTORY:  chest pain/shortness of breath FINDINGS: LUNGS AND PLEURA: No focal pulmonary opacity. No pulmonary edema. No pleural effusion. No pneumothorax. HEART AND MEDIASTINUM: Mild cardiomegaly. Tortuous aorta with aortic atherosclerosis. BONES AND SOFT TISSUES: Multilevel thoracic osteophytosis. IMPRESSION: 1. No acute cardiopulmonary abnormality. Electronically signed by: Rogelia Myers MD 07/22/2024 02:02 PM EST RP Workstation: [id number]   PROCEDURES: Critical Care performed: No Procedures MEDICATIONS ORDERED IN ED: Medications - No data to display IMPRESSION / MDM / ASSESSMENT AND PLAN / ED COURSE  I  reviewed the triage vital signs and the nursing notes.                             The patient is on the cardiac monitor to evaluate for evidence of arrhythmia and/or significant heart rate changes. Patient's presentation is most consistent with acute presentation with potential threat to life or bodily function. Patient is a 58 year old male with the above-stated past medical history that presents for left-sided chest pain DDx: Pneumothorax, pneumonia, pericarditis, ACS Plan: CBC, BMP, troponin, chest x-ray, EKG, bedside ultrasound  Patient's laboratory and radiologic evaluation does not show any evidence of acute abnormalities except for bedside ultrasound that shows a small amount of free fluid in the pericardium concerning for possible pericarditis.  Given the patient has worsening symptoms when he is laying flat that resolves when he is sitting forward, I will treat patient empirically with a course of indomethacin.  Patient was instructed to follow-up with his primary care provider for monitoring of resolution.  Patient agrees with this plan.  Patient given strict return precautions and all questions answered prior to discharge  Dispo: Discharge home with PCP follow-up   FINAL CLINICAL IMPRESSION(S) / ED DIAGNOSES   Final diagnoses:  Left-sided chest pain  Acute pericarditis, unspecified type   Rx / DC Orders   ED Discharge Orders          Ordered    indomethacin (INDOCIN) 50 MG capsule  3 times daily with meals        07/22/24 1419           Note:  This document was prepared using Dragon voice recognition software and may include unintentional dictation errors.   Carmello Cabiness K, MD 07/22/24 [phone]

## 2024-07-22 NOTE — ED Notes (Signed)
 Pt given DC instructions. Pt verbalized understanding of follow up care. Pt ambulatory from ED without difficulty.

## 2024-07-22 NOTE — ED Triage Notes (Signed)
 Patient states left sided chest pain and shortness of breath since yesterday; history of Pneumothorax.
